# Patient Record
Sex: Female | Born: 1950 | Race: White | Hispanic: No | Marital: Married | Smoking: Never smoker
Health system: Southern US, Community
[De-identification: ages and names within clinical notes are randomized; demographics above are authoritative.]

## PROBLEM LIST (undated history)

## (undated) DIAGNOSIS — R109 Unspecified abdominal pain: Secondary | ICD-10-CM

## (undated) DIAGNOSIS — R6889 Other general symptoms and signs: Secondary | ICD-10-CM

## (undated) DIAGNOSIS — I472 Ventricular tachycardia, unspecified: Secondary | ICD-10-CM

## (undated) DIAGNOSIS — J22 Unspecified acute lower respiratory infection: Secondary | ICD-10-CM

## (undated) DIAGNOSIS — R0989 Other specified symptoms and signs involving the circulatory and respiratory systems: Secondary | ICD-10-CM

## (undated) DIAGNOSIS — M62838 Other muscle spasm: Secondary | ICD-10-CM

## (undated) DIAGNOSIS — R635 Abnormal weight gain: Secondary | ICD-10-CM

## (undated) DIAGNOSIS — Z9889 Other specified postprocedural states: Secondary | ICD-10-CM

## (undated) DIAGNOSIS — R29898 Other symptoms and signs involving the musculoskeletal system: Secondary | ICD-10-CM

## (undated) DIAGNOSIS — R059 Cough, unspecified: Secondary | ICD-10-CM

## (undated) DIAGNOSIS — R0602 Shortness of breath: Secondary | ICD-10-CM

## (undated) DIAGNOSIS — J069 Acute upper respiratory infection, unspecified: Secondary | ICD-10-CM

## (undated) DIAGNOSIS — R001 Bradycardia, unspecified: Secondary | ICD-10-CM

## (undated) DIAGNOSIS — L03019 Cellulitis of unspecified finger: Secondary | ICD-10-CM

## (undated) DIAGNOSIS — R9431 Abnormal electrocardiogram [ECG] [EKG]: Secondary | ICD-10-CM

## (undated) DIAGNOSIS — R7302 Impaired glucose tolerance (oral): Secondary | ICD-10-CM

## (undated) DIAGNOSIS — M797 Fibromyalgia: Secondary | ICD-10-CM

## (undated) DIAGNOSIS — R52 Pain, unspecified: Secondary | ICD-10-CM

## (undated) DIAGNOSIS — R5383 Other fatigue: Secondary | ICD-10-CM

## (undated) DIAGNOSIS — R0981 Nasal congestion: Secondary | ICD-10-CM

## (undated) DIAGNOSIS — J40 Bronchitis, not specified as acute or chronic: Secondary | ICD-10-CM

## (undated) DIAGNOSIS — R55 Syncope and collapse: Secondary | ICD-10-CM

## (undated) DIAGNOSIS — R319 Hematuria, unspecified: Secondary | ICD-10-CM

## (undated) DIAGNOSIS — K862 Cyst of pancreas: Secondary | ICD-10-CM

## (undated) DIAGNOSIS — R062 Wheezing: Secondary | ICD-10-CM

## (undated) DIAGNOSIS — I10 Essential (primary) hypertension: Secondary | ICD-10-CM

## (undated) DIAGNOSIS — M129 Arthropathy, unspecified: Secondary | ICD-10-CM

## (undated) DIAGNOSIS — J329 Chronic sinusitis, unspecified: Secondary | ICD-10-CM

## (undated) DIAGNOSIS — I495 Sick sinus syndrome: Secondary | ICD-10-CM

## (undated) DIAGNOSIS — M1990 Unspecified osteoarthritis, unspecified site: Secondary | ICD-10-CM

## (undated) DIAGNOSIS — R42 Dizziness and giddiness: Secondary | ICD-10-CM

## (undated) DIAGNOSIS — R17 Unspecified jaundice: Secondary | ICD-10-CM

## (undated) DIAGNOSIS — G459 Transient cerebral ischemic attack, unspecified: Secondary | ICD-10-CM

## (undated) DIAGNOSIS — E785 Hyperlipidemia, unspecified: Secondary | ICD-10-CM

## (undated) DIAGNOSIS — I340 Nonrheumatic mitral (valve) insufficiency: Secondary | ICD-10-CM

## (undated) DIAGNOSIS — N390 Urinary tract infection, site not specified: Secondary | ICD-10-CM

## (undated) DIAGNOSIS — Z9109 Other allergy status, other than to drugs and biological substances: Secondary | ICD-10-CM

## (undated) DIAGNOSIS — D649 Anemia, unspecified: Secondary | ICD-10-CM

## (undated) HISTORY — DX: Other symptoms and signs involving the musculoskeletal system: R29.898

## (undated) HISTORY — DX: Other fatigue: R53.83

## (undated) HISTORY — DX: Arthropathy, unspecified: M12.9

## (undated) HISTORY — DX: Wheezing: R06.2

## (undated) HISTORY — DX: Cyst of pancreas: K86.2

## (undated) HISTORY — DX: Cough, unspecified: R05.9

## (undated) HISTORY — DX: Bronchitis, not specified as acute or chronic: J40

## (undated) HISTORY — DX: Bradycardia, unspecified: R00.1

## (undated) HISTORY — DX: Dizziness and giddiness: R42

## (undated) HISTORY — DX: Cellulitis of unspecified finger: L03.019

## (undated) HISTORY — DX: Syncope and collapse: R55

## (undated) HISTORY — DX: Anemia, unspecified: D64.9

## (undated) HISTORY — DX: Impaired glucose tolerance (oral): R73.02

## (undated) HISTORY — DX: Other allergy status, other than to drugs and biological substances: Z91.09

## (undated) HISTORY — DX: Sick sinus syndrome: I49.5

## (undated) HISTORY — DX: Unspecified osteoarthritis, unspecified site: M19.90

## (undated) HISTORY — PX: BACK SURGERY: SHX140

## (undated) HISTORY — DX: Shortness of breath: R06.02

## (undated) HISTORY — DX: Other specified symptoms and signs involving the circulatory and respiratory systems: R09.89

## (undated) HISTORY — DX: Unspecified abdominal pain: R10.9

## (undated) HISTORY — DX: Other general symptoms and signs: R68.89

## (undated) HISTORY — PX: OTHER SURGICAL HISTORY: SHX169

## (undated) HISTORY — PX: HYSTERECTOMY ABDOMINAL WITH SALPINGECTOMY: SHX6725

## (undated) HISTORY — DX: Hematuria, unspecified: R31.9

## (undated) HISTORY — DX: Other muscle spasm: M62.838

## (undated) HISTORY — PX: GALLBLADDER SURGERY: SHX652

## (undated) HISTORY — DX: Acute upper respiratory infection, unspecified: J06.9

## (undated) HISTORY — DX: Abnormal weight gain: R63.5

## (undated) HISTORY — PX: CHOLECYSTECTOMY: SHX55

## (undated) HISTORY — PX: TOTAL SHOULDER REPLACEMENT: SUR1217

## (undated) HISTORY — DX: Urinary tract infection, site not specified: N39.0

## (undated) HISTORY — DX: Hyperlipidemia, unspecified: E78.5

## (undated) HISTORY — DX: Unspecified acute lower respiratory infection: J22

## (undated) HISTORY — DX: Ventricular tachycardia, unspecified: I47.20

## (undated) HISTORY — DX: Unspecified jaundice: R17

## (undated) HISTORY — DX: Nasal congestion: R09.81

## (undated) HISTORY — DX: Nonrheumatic mitral (valve) insufficiency: I34.0

## (undated) HISTORY — DX: Abnormal electrocardiogram (ECG) (EKG): R94.31

## (undated) HISTORY — DX: Chronic sinusitis, unspecified: J32.9

## (undated) HISTORY — DX: Pain, unspecified: R52

---

## 2011-04-18 DIAGNOSIS — M797 Fibromyalgia: Secondary | ICD-10-CM | POA: Insufficient documentation

## 2011-04-18 DIAGNOSIS — M545 Low back pain, unspecified: Secondary | ICD-10-CM | POA: Insufficient documentation

## 2011-04-18 DIAGNOSIS — M6530 Trigger finger, unspecified finger: Secondary | ICD-10-CM | POA: Insufficient documentation

## 2012-06-07 DIAGNOSIS — R059 Cough, unspecified: Secondary | ICD-10-CM | POA: Insufficient documentation

## 2012-07-21 DIAGNOSIS — J302 Other seasonal allergic rhinitis: Secondary | ICD-10-CM | POA: Insufficient documentation

## 2014-03-27 DIAGNOSIS — H9209 Otalgia, unspecified ear: Secondary | ICD-10-CM | POA: Insufficient documentation

## 2014-03-27 DIAGNOSIS — J309 Allergic rhinitis, unspecified: Secondary | ICD-10-CM | POA: Insufficient documentation

## 2014-10-12 DIAGNOSIS — M159 Polyosteoarthritis, unspecified: Secondary | ICD-10-CM | POA: Insufficient documentation

## 2014-10-12 DIAGNOSIS — I10 Essential (primary) hypertension: Secondary | ICD-10-CM | POA: Insufficient documentation

## 2014-12-05 DIAGNOSIS — N951 Menopausal and female climacteric states: Secondary | ICD-10-CM | POA: Insufficient documentation

## 2015-04-03 DIAGNOSIS — R202 Paresthesia of skin: Secondary | ICD-10-CM | POA: Insufficient documentation

## 2015-05-30 DIAGNOSIS — D509 Iron deficiency anemia, unspecified: Secondary | ICD-10-CM | POA: Insufficient documentation

## 2015-05-30 DIAGNOSIS — D649 Anemia, unspecified: Secondary | ICD-10-CM | POA: Insufficient documentation

## 2015-10-22 DIAGNOSIS — G459 Transient cerebral ischemic attack, unspecified: Secondary | ICD-10-CM | POA: Insufficient documentation

## 2015-10-28 DIAGNOSIS — I9581 Postprocedural hypotension: Secondary | ICD-10-CM | POA: Insufficient documentation

## 2015-10-28 DIAGNOSIS — M19012 Primary osteoarthritis, left shoulder: Secondary | ICD-10-CM | POA: Insufficient documentation

## 2015-10-29 DIAGNOSIS — I9581 Postprocedural hypotension: Secondary | ICD-10-CM | POA: Insufficient documentation

## 2015-10-29 DIAGNOSIS — D62 Acute posthemorrhagic anemia: Secondary | ICD-10-CM | POA: Insufficient documentation

## 2015-11-21 DIAGNOSIS — Z9181 History of falling: Secondary | ICD-10-CM | POA: Insufficient documentation

## 2015-12-17 DIAGNOSIS — Z96612 Presence of left artificial shoulder joint: Secondary | ICD-10-CM | POA: Insufficient documentation

## 2015-12-31 DIAGNOSIS — N8111 Cystocele, midline: Secondary | ICD-10-CM | POA: Insufficient documentation

## 2016-01-02 DIAGNOSIS — R339 Retention of urine, unspecified: Secondary | ICD-10-CM | POA: Insufficient documentation

## 2016-01-21 DIAGNOSIS — R7303 Prediabetes: Secondary | ICD-10-CM | POA: Insufficient documentation

## 2016-02-06 DIAGNOSIS — K921 Melena: Secondary | ICD-10-CM | POA: Insufficient documentation

## 2016-02-06 DIAGNOSIS — Z79899 Other long term (current) drug therapy: Secondary | ICD-10-CM | POA: Insufficient documentation

## 2016-02-06 DIAGNOSIS — Z860100 Personal history of colon polyps, unspecified: Secondary | ICD-10-CM | POA: Insufficient documentation

## 2016-02-06 DIAGNOSIS — R195 Other fecal abnormalities: Secondary | ICD-10-CM | POA: Insufficient documentation

## 2016-02-06 DIAGNOSIS — Z791 Long term (current) use of non-steroidal anti-inflammatories (NSAID): Secondary | ICD-10-CM | POA: Insufficient documentation

## 2016-07-16 DIAGNOSIS — K5790 Diverticulosis of intestine, part unspecified, without perforation or abscess without bleeding: Secondary | ICD-10-CM | POA: Insufficient documentation

## 2016-10-15 DIAGNOSIS — M75121 Complete rotator cuff tear or rupture of right shoulder, not specified as traumatic: Secondary | ICD-10-CM | POA: Insufficient documentation

## 2017-03-25 DIAGNOSIS — H40043 Steroid responder, bilateral: Secondary | ICD-10-CM | POA: Insufficient documentation

## 2017-03-25 DIAGNOSIS — H16223 Keratoconjunctivitis sicca, not specified as Sjogren's, bilateral: Secondary | ICD-10-CM | POA: Insufficient documentation

## 2017-03-25 DIAGNOSIS — Z83511 Family history of glaucoma: Secondary | ICD-10-CM | POA: Insufficient documentation

## 2017-03-25 DIAGNOSIS — M154 Erosive (osteo)arthritis: Secondary | ICD-10-CM | POA: Insufficient documentation

## 2017-03-25 DIAGNOSIS — H2513 Age-related nuclear cataract, bilateral: Secondary | ICD-10-CM | POA: Insufficient documentation

## 2017-03-25 DIAGNOSIS — H524 Presbyopia: Secondary | ICD-10-CM | POA: Insufficient documentation

## 2017-03-25 DIAGNOSIS — Z79899 Other long term (current) drug therapy: Secondary | ICD-10-CM | POA: Insufficient documentation

## 2017-03-25 DIAGNOSIS — H33322 Round hole, left eye: Secondary | ICD-10-CM | POA: Insufficient documentation

## 2017-07-08 DIAGNOSIS — G609 Hereditary and idiopathic neuropathy, unspecified: Secondary | ICD-10-CM | POA: Insufficient documentation

## 2017-11-05 DIAGNOSIS — Z79890 Hormone replacement therapy (postmenopausal): Secondary | ICD-10-CM | POA: Insufficient documentation

## 2017-11-05 DIAGNOSIS — Z90710 Acquired absence of both cervix and uterus: Secondary | ICD-10-CM | POA: Insufficient documentation

## 2018-01-02 DIAGNOSIS — R232 Flushing: Secondary | ICD-10-CM | POA: Insufficient documentation

## 2018-02-24 DIAGNOSIS — K862 Cyst of pancreas: Secondary | ICD-10-CM | POA: Insufficient documentation

## 2018-05-02 DIAGNOSIS — Z8673 Personal history of transient ischemic attack (TIA), and cerebral infarction without residual deficits: Secondary | ICD-10-CM | POA: Insufficient documentation

## 2018-06-16 DIAGNOSIS — R140 Abdominal distension (gaseous): Secondary | ICD-10-CM | POA: Insufficient documentation

## 2018-06-16 DIAGNOSIS — K219 Gastro-esophageal reflux disease without esophagitis: Secondary | ICD-10-CM | POA: Insufficient documentation

## 2018-06-21 DIAGNOSIS — I4729 Other ventricular tachycardia: Secondary | ICD-10-CM | POA: Insufficient documentation

## 2018-06-24 DIAGNOSIS — R0602 Shortness of breath: Secondary | ICD-10-CM | POA: Insufficient documentation

## 2018-07-16 DIAGNOSIS — I071 Rheumatic tricuspid insufficiency: Secondary | ICD-10-CM | POA: Insufficient documentation

## 2018-07-19 DIAGNOSIS — E559 Vitamin D deficiency, unspecified: Secondary | ICD-10-CM | POA: Insufficient documentation

## 2018-08-02 DIAGNOSIS — M8580 Other specified disorders of bone density and structure, unspecified site: Secondary | ICD-10-CM | POA: Insufficient documentation

## 2019-01-12 DIAGNOSIS — E785 Hyperlipidemia, unspecified: Secondary | ICD-10-CM | POA: Insufficient documentation

## 2019-02-26 ENCOUNTER — Emergency Department (HOSPITAL_BASED_OUTPATIENT_CLINIC_OR_DEPARTMENT_OTHER)
Admission: EM | Admit: 2019-02-26 | Discharge: 2019-02-26 | Disposition: A | Discharge status: Home / Self Care | Payer: Medicare Other | Attending: Emergency Medicine | Admitting: Emergency Medicine

## 2019-02-26 ENCOUNTER — Other Ambulatory Visit: Payer: Self-pay

## 2019-02-26 ENCOUNTER — Encounter (HOSPITAL_BASED_OUTPATIENT_CLINIC_OR_DEPARTMENT_OTHER): Payer: Self-pay | Admitting: Emergency Medicine

## 2019-02-26 DIAGNOSIS — I10 Essential (primary) hypertension: Secondary | ICD-10-CM | POA: Insufficient documentation

## 2019-02-26 DIAGNOSIS — Z79899 Other long term (current) drug therapy: Secondary | ICD-10-CM | POA: Diagnosis not present

## 2019-02-26 DIAGNOSIS — Z7901 Long term (current) use of anticoagulants: Secondary | ICD-10-CM | POA: Diagnosis not present

## 2019-02-26 DIAGNOSIS — N12 Tubulo-interstitial nephritis, not specified as acute or chronic: Secondary | ICD-10-CM | POA: Diagnosis not present

## 2019-02-26 DIAGNOSIS — Z8673 Personal history of transient ischemic attack (TIA), and cerebral infarction without residual deficits: Secondary | ICD-10-CM | POA: Insufficient documentation

## 2019-02-26 DIAGNOSIS — M545 Low back pain: Secondary | ICD-10-CM | POA: Diagnosis present

## 2019-02-26 HISTORY — DX: Essential (primary) hypertension: I10

## 2019-02-26 HISTORY — DX: Transient cerebral ischemic attack, unspecified: G45.9

## 2019-02-26 LAB — URINALYSIS, ROUTINE W REFLEX MICROSCOPIC
Bilirubin Urine: NEGATIVE
Glucose, UA: NEGATIVE mg/dL
Ketones, ur: NEGATIVE mg/dL
Nitrite: NEGATIVE
Protein, ur: NEGATIVE mg/dL
Specific Gravity, Urine: 1.02 (ref 1.005–1.030)
pH: 6.5 (ref 5.0–8.0)

## 2019-02-26 LAB — URINALYSIS, MICROSCOPIC (REFLEX)

## 2019-02-26 MED ORDER — CEPHALEXIN 500 MG PO CAPS: 500.0000 mg | ORAL_CAPSULE | Freq: Four times a day (QID) | ORAL | 0 refills | Status: AC

## 2019-02-26 NOTE — ED Triage Notes (Signed)
L lower back pain x 2 days. Denies injury. Endorses urinary frequency. Recently tx for UTI.

## 2019-02-26 NOTE — Discharge Instructions (Signed)
Your flank pain is likely secondary to a kidney infection.  The medical term for this is called pyelonephritis.  Will give you an antibiotic called Keflex, you will take this 4 times per day and the dose will be 500 mg.  Take it for 7 days.  Her symptoms do not improve we have any new symptoms would recommend follow-up with PCP.

## 2019-02-26 NOTE — ED Notes (Signed)
ED Provider at bedside. 

## 2019-02-26 NOTE — ED Provider Notes (Signed)
[facility] EMERGENCY DEPARTMENT Provider Note   CSN: [id number] Arrival date & time: 02/26/19  0845    History   Chief Complaint Chief Complaint  Patient presents with  . Back Pain    HPI Angel Cherry is a 68 y.o. female who presents for 3-day history of left flank/back pain.  Patient states that the pain gradually throughout the day on Friday.  It has slowly gotten worse since then.  She has tried a heating pad, and 1 dose of Tylenol which have not helped her.  She has not had any fevers as far she knows.  She has urinary frequency, but no burning or cramping.   Of note patient was treated for a UTI by her PCP 3 to 4 weeks ago.  She was given ciprofloxacin for 5 days, which she completed.   Past Medical History:  Diagnosis Date  . Hypertension   . TIA (transient ischemic attack)     There are no active problems to display for this patient.   History reviewed. No pertinent surgical history.   OB History   No obstetric history on file.      Home Medications    Prior to Admission medications   Medication Sig Start Date End Date Taking? Authorizing Provider  atorvastatin (LIPITOR) 20 MG tablet Take 20 mg by mouth daily.   Yes [provider]  clopidogrel (PLAVIX) 75 MG tablet Take 75 mg by mouth daily.   Yes [provider]  lisinopril (ZESTRIL) 20 MG tablet Take 20 mg by mouth daily.   Yes [provider]  magnesium 30 MG tablet Take 200 mg by mouth 2 (two) times daily.   Yes [provider]  metoprolol tartrate (LOPRESSOR) 25 MG tablet Take 25 mg by mouth 2 (two) times daily.   Yes [provider]  montelukast (SINGULAIR) 10 MG tablet Take 10 mg by mouth at bedtime.   Yes [provider]  pantoprazole (PROTONIX) 40 MG tablet Take 40 mg by mouth daily.   Yes [provider]  traZODone (DESYREL) 150 MG tablet Take by mouth at bedtime.   Yes [provider]  cephALEXin (KEFLEX) 500 MG  capsule Take 1 capsule (500 mg total) by mouth 4 (four) times daily for 7 days. 02/26/19 03/05/19  Myrene BuddyFletcher, Andrya Roppolo, MD    Family History No family history on file.  Social History Social History   Tobacco Use  . Smoking status: Never Smoker  . Smokeless tobacco: Never Used  Substance Use Topics  . Alcohol use: Not Currently  . Drug use: Not on file     Allergies   Daypro [oxaprozin]   Review of Systems Review of Systems  Constitutional: Negative for activity change, appetite change, chills and fever.  Respiratory: Negative for apnea, cough and chest tightness.   Cardiovascular: Negative for chest pain.  Gastrointestinal: Negative for abdominal distention, abdominal pain, constipation, diarrhea, nausea and vomiting.  Genitourinary: Positive for flank pain, frequency and urgency. Negative for hematuria and pelvic pain.  Musculoskeletal: Positive for back pain.  Neurological: Negative for light-headedness.  Psychiatric/Behavioral: Negative for confusion.    Physical Exam Updated Vital Signs BP (!) 150/97 (BP Location: Left Arm)   Pulse 65   Temp 99.1 F (37.3 C) (Oral)   Resp 18   Ht 5\' 2"  (1.575 m)   Wt 63.5 kg   SpO2 99%   BMI 25.61 kg/m   Physical Exam Constitutional:      General: She is not  in acute distress.    Appearance: Normal appearance. She is not ill-appearing.  HENT:     Head: Normocephalic and atraumatic.     Mouth/Throat:     Mouth: Mucous membranes are moist.  Neck:     Musculoskeletal: Normal range of motion.  Cardiovascular:     Rate and Rhythm: Normal rate and regular rhythm.     Heart sounds: Normal heart sounds. No murmur. No friction rub.  Pulmonary:     Effort: Pulmonary effort is normal. No respiratory distress.     Breath sounds: Normal breath sounds.  Abdominal:     General: Abdomen is flat. There is no distension.     Palpations: Abdomen is soft.     Tenderness: There is left CVA tenderness.  Musculoskeletal: Normal range of  motion.  Skin:    General: Skin is warm.     Capillary Refill: Capillary refill takes less than 2 seconds.  Neurological:     General: No focal deficit present.     Mental Status: She is alert and oriented to person, place, and time.     Cranial Nerves: No cranial nerve deficit.  Psychiatric:        Mood and Affect: Mood normal.   Back: Moderate tenderness to palpation left flank/back and CVA distribution.  Able to flex back with no issue.  Unable to extend back or rotate back to any significant degree due to pain.   ED Treatments / Results  Labs (all labs ordered are listed, but only abnormal results are displayed) Labs Reviewed  URINALYSIS, ROUTINE W REFLEX MICROSCOPIC - Abnormal; Notable for the following components:      Result Value   Hgb urine dipstick TRACE (*)    Leukocytes,Ua SMALL (*)    All other components within normal limits  URINALYSIS, MICROSCOPIC (REFLEX) - Abnormal; Notable for the following components:   Bacteria, UA MANY (*)    All other components within normal limits  UA: Trace Hgb, small leuks High-power field: Many bacteria, 6-10 squamous epithelial cells, 6-10 RBC, 11-20 white blood cell.  Mucus and granular casts present  EKG None  Radiology No results found.  Procedures Procedures  Medications Ordered in ED Medications - No data to display   Initial Impression / Assessment and Plan / ED Course  I have reviewed the triage vital signs and the nursing notes.  Pertinent labs & imaging results that were available during my care of the patient were reviewed by me and considered in my medical decision making (see chart for details).   68 year old female with left CVA tenderness.  UA and high-power field showing bacteria, red blood cells, and leukocytes.  Patient's presentation and exam consistent with pyelonephritis.  Will treat with Keflex 500 mg 4 times daily for 7 days.  Given this is patient's second UTI in 3 to 4 weeks, could consider additional  anatomic (I.E. Kidney stones) work-up if she presents with similar symptoms.     Final Clinical Impressions(s) / ED Diagnoses   Final diagnoses:  Pyelonephritis    ED Discharge Orders         Ordered    cephALEXin (KEFLEX) 500 MG capsule  4 times daily     02/26/19 0931           Guadalupe Dawn, MD 02/26/19 6237    Lajean Saver, MD 02/26/19 1601    Lajean Saver, MD 02/26/19 [phone]

## 2019-04-11 DIAGNOSIS — Z9889 Other specified postprocedural states: Secondary | ICD-10-CM | POA: Insufficient documentation

## 2019-04-11 DIAGNOSIS — Z96611 Presence of right artificial shoulder joint: Secondary | ICD-10-CM | POA: Insufficient documentation

## 2019-04-25 DIAGNOSIS — R300 Dysuria: Secondary | ICD-10-CM | POA: Insufficient documentation

## 2019-07-27 DIAGNOSIS — G9520 Unspecified cord compression: Secondary | ICD-10-CM | POA: Insufficient documentation

## 2019-08-25 DIAGNOSIS — N3281 Overactive bladder: Secondary | ICD-10-CM | POA: Insufficient documentation

## 2020-03-20 DIAGNOSIS — I499 Cardiac arrhythmia, unspecified: Secondary | ICD-10-CM | POA: Insufficient documentation

## 2020-05-31 ENCOUNTER — Ambulatory Visit: Payer: Medicare Other | Attending: Internal Medicine

## 2020-05-31 DIAGNOSIS — Z23 Encounter for immunization: Secondary | ICD-10-CM

## 2020-05-31 NOTE — Progress Notes (Signed)
   Covid-19 Vaccination Clinic  Name:  Angel Cherry    MRN: [id number] DOB: 10/26/1950  05/31/2020  [name] was observed post Covid-19 immunization for 15 minutes without incident. She was provided with Vaccine Information Sheet and instruction to access the V-Safe system.   [name] was instructed to call [phone] with any severe reactions post vaccine: Marland Kitchen Difficulty breathing  . Swelling of face and throat  . A fast heartbeat  . A bad rash all over body  . Dizziness and weakness

## 2020-07-27 HISTORY — PX: CATARACT EXTRACTION: SUR2

## 2021-01-07 DIAGNOSIS — M7552 Bursitis of left shoulder: Secondary | ICD-10-CM | POA: Insufficient documentation

## 2021-05-14 ENCOUNTER — Other Ambulatory Visit: Payer: Self-pay

## 2021-05-14 ENCOUNTER — Emergency Department (HOSPITAL_BASED_OUTPATIENT_CLINIC_OR_DEPARTMENT_OTHER)
Admission: EM | Admit: 2021-05-14 | Discharge: 2021-05-14 | Disposition: A | Discharge status: Home / Self Care | Payer: Medicare Other | Attending: Emergency Medicine | Admitting: Emergency Medicine

## 2021-05-14 ENCOUNTER — Emergency Department (HOSPITAL_COMMUNITY): Payer: Medicare Other

## 2021-05-14 ENCOUNTER — Encounter (HOSPITAL_BASED_OUTPATIENT_CLINIC_OR_DEPARTMENT_OTHER): Payer: Self-pay | Admitting: Emergency Medicine

## 2021-05-14 DIAGNOSIS — Z8673 Personal history of transient ischemic attack (TIA), and cerebral infarction without residual deficits: Secondary | ICD-10-CM | POA: Insufficient documentation

## 2021-05-14 DIAGNOSIS — E860 Dehydration: Secondary | ICD-10-CM | POA: Diagnosis not present

## 2021-05-14 DIAGNOSIS — Z79899 Other long term (current) drug therapy: Secondary | ICD-10-CM | POA: Insufficient documentation

## 2021-05-14 DIAGNOSIS — R531 Weakness: Secondary | ICD-10-CM | POA: Insufficient documentation

## 2021-05-14 DIAGNOSIS — Z7902 Long term (current) use of antithrombotics/antiplatelets: Secondary | ICD-10-CM | POA: Diagnosis not present

## 2021-05-14 DIAGNOSIS — I10 Essential (primary) hypertension: Secondary | ICD-10-CM | POA: Insufficient documentation

## 2021-05-14 LAB — BASIC METABOLIC PANEL
Anion gap: 6 (ref 5–15)
BUN: 17 mg/dL (ref 8–23)
CO2: 26 mmol/L (ref 22–32)
Calcium: 9 mg/dL (ref 8.9–10.3)
Chloride: 104 mmol/L (ref 98–111)
Creatinine, Ser: 0.9 mg/dL (ref 0.44–1.00)
GFR, Estimated: >60 mL/min (ref >60)
Glucose, Bld: 100 mg/dL — ABNORMAL HIGH (ref 70–99)
Potassium: 3.8 mmol/L (ref 3.5–5.1)
Sodium: 136 mmol/L (ref 135–145)

## 2021-05-14 LAB — CBC WITH DIFFERENTIAL/PLATELET
Abs Immature Granulocytes: 0.02 10*3/uL (ref 0.00–0.07)
Basophils Absolute: 0 10*3/uL (ref 0.0–0.1)
Basophils Relative: 1 %
Eosinophils Absolute: 0.1 10*3/uL (ref 0.0–0.5)
Eosinophils Relative: 2 %
HCT: 31.4 % — ABNORMAL LOW (ref 36.0–46.0)
Hemoglobin: 11 g/dL — ABNORMAL LOW (ref 12.0–15.0)
Immature Granulocytes: 0 %
Lymphocytes Relative: 23 %
Lymphs Abs: 1.1 10*3/uL (ref 0.7–4.0)
MCH: 31.5 pg (ref 26.0–34.0)
MCHC: 35 g/dL (ref 30.0–36.0)
MCV: 90 fL (ref 80.0–100.0)
Monocytes Absolute: 0.4 10*3/uL (ref 0.1–1.0)
Monocytes Relative: 7 %
Neutro Abs: 3.2 10*3/uL (ref 1.7–7.7)
Neutrophils Relative %: 67 %
Platelets: 253 10*3/uL (ref 150–400)
RBC: 3.49 MIL/uL — ABNORMAL LOW (ref 3.87–5.11)
RDW: 13 % (ref 11.5–15.5)
WBC: 4.8 10*3/uL (ref 4.0–10.5)
nRBC: 0 % (ref 0.0–0.2)

## 2021-05-14 MED ORDER — MECLIZINE HCL 25 MG PO TABS
25.0000 mg | ORAL_TABLET | Freq: Once | ORAL | Status: AC
  Administered 2021-05-14: 25 mg via ORAL
  Filled 2021-05-14: qty 1

## 2021-05-14 MED ORDER — DEXAMETHASONE 4 MG PO TABS
10.0000 mg | ORAL_TABLET | Freq: Once | ORAL | Status: AC
  Administered 2021-05-14: 10 mg via ORAL
  Filled 2021-05-14: qty 3

## 2021-05-14 MED ORDER — MECLIZINE HCL 25 MG PO TABS: 25.0000 mg | ORAL_TABLET | Freq: Three times a day (TID) | ORAL | 0 refills | Status: AC | PRN

## 2021-05-14 MED ORDER — SODIUM CHLORIDE 0.9 % IV BOLUS
1000.0000 mL | Freq: Once | INTRAVENOUS | Status: AC
  Administered 2021-05-14: 1000 mL via INTRAVENOUS

## 2021-05-14 NOTE — ED Provider Notes (Signed)
[facility] EMERGENCY DEPARTMENT Provider Note   CSN: [id number] Arrival date & time: 05/14/21  7867     History Chief Complaint  Patient presents with   Weakness    fatigue    Clinical Course as of 05/14/21 1720  Wed May 14, 2021  1247 70 yo female presenting with dizziness, worse with standing, for several weeks.  Family doc referred to ED for dehydration evaluation and CVA rule out.  [place] transferred [place] brain.  Pt received 1 L IVF and antivert and decadron for mild dehydration and vertigo/mild ataxia - sent for CVA rule out on MRI. [MT]  1255 IMPRESSION: No acute or reversible intracranial finding. Few foci of abnormal T2 and FLAIR signal within the white matter of the forceps major that could represent a manifestation of early/mild chronic small vessel disease or could relate to prenatal or perinatal insult.   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 [MT]    Clinical Course User Index [MT] Terald Sleeper, MD   Patient reports that her PCP is checked for her UTI with a urine culture just this week, which was negative.  She had been treated for UTI earlier in the month.  She also reports had a negative PCR and COVID flu test earlier this week.  She denies any chest pain, pressure, difficulty breathing.  HPI     Past Medical History:  Diagnosis Date   Hypertension    TIA (transient ischemic attack)     There are no problems to display for this patient.   History reviewed. No pertinent surgical history.   OB History   No obstetric history on file.     No family history on file.  Social History   Tobacco Use   Smoking status: Never   Smokeless tobacco: Never  Substance Use Topics   Alcohol use: Not Currently    Home Medications Prior to Admission medications   Medication Sig Start Date End Date Taking? Authorizing Provider  meclizine (ANTIVERT) 25 MG tablet Take 1 tablet (25 mg total) by mouth  3 (three) times daily as needed for up to 21 days for dizziness. 05/14/21 06/04/21 Yes Treanna Dumler, Kermit Balo, MD  atorvastatin (LIPITOR) 20 MG tablet Take 20 mg by mouth daily.    [provider]  clopidogrel (PLAVIX) 75 MG tablet Take 75 mg by mouth daily.    [provider]  lisinopril (ZESTRIL) 20 MG tablet Take 20 mg by mouth daily.    [provider]  magnesium 30 MG tablet Take 200 mg by mouth 2 (two) times daily.    [provider]  metoprolol tartrate (LOPRESSOR) 25 MG tablet Take 25 mg by mouth 2 (two) times daily.    [provider]  montelukast (SINGULAIR) 10 MG tablet Take 10 mg by mouth at bedtime.    [provider]  pantoprazole (PROTONIX) 40 MG tablet Take 40 mg by mouth daily.    [provider]  traZODone (DESYREL) 150 MG tablet Take by mouth at bedtime.    [provider]    Allergies    Denture adhesive, Oxycodone, Terconazole, Daypro [oxaprozin], and Venlafaxine  Review of Systems   Review of Systems  Constitutional:  Negative for chills and fever.  Respiratory:  Negative for cough and shortness of breath.   Cardiovascular:  Negative for chest pain and palpitations.  Gastrointestinal:  Negative for abdominal pain and vomiting.  Genitourinary:  Negative for dysuria and hematuria.  Musculoskeletal:  Negative for arthralgias and back pain.  Skin:  Negative for color change and rash.  Neurological:  Positive for light-headedness. Negative for syncope, facial asymmetry, speech difficulty, weakness and numbness.  All other systems reviewed and are negative.  Physical Exam Updated Vital Signs BP (!) 145/67   Pulse 65   Temp 98 F (36.7 C) (Oral)   Resp 16   Ht 5\' 2"  (1.575 m)   Wt 59 kg   SpO2 99%   BMI 23.78 kg/m   Physical Exam Constitutional:      General: She is not in acute distress. HENT:     Head: Normocephalic and atraumatic.  Eyes:     Conjunctiva/sclera: Conjunctivae normal.      Pupils: Pupils are equal, round, and reactive to light.  Cardiovascular:     Rate and Rhythm: Normal rate and regular rhythm.  Pulmonary:     Effort: Pulmonary effort is normal. No respiratory distress.  Abdominal:     General: There is no distension.     Tenderness: There is no abdominal tenderness.  Skin:    General: Skin is warm and dry.  Neurological:     General: No focal deficit present.     Mental Status: She is alert and oriented to person, place, and time. Mental status is at baseline.     Motor: No weakness.  Psychiatric:        Mood and Affect: Mood normal.        Behavior: Behavior normal.    ED Results / Procedures / Treatments   Labs (all labs ordered are listed, but only abnormal results are displayed) Labs Reviewed  CBC WITH DIFFERENTIAL/PLATELET - Abnormal; Notable for the following components:      Result Value   RBC 3.49 (*)    Hemoglobin 11.0 (*)    HCT 31.4 (*)    All other components within normal limits  BASIC METABOLIC PANEL - Abnormal; Notable for the following components:   Glucose, Bld 100 (*)    All other components within normal limits    EKG EKG Interpretation  Date/Time:  Wednesday May 14 2021 08:36:47 EDT Ventricular Rate:  57 PR Interval:  142 QRS Duration: 95 QT Interval:  428 QTC Calculation: 417 R Axis:   52 Text Interpretation: Sinus rhythm Probable anteroseptal infarct, old No old tracing to compare Confirmed by [phone] [id number]) on 05/14/2021 9:08:10 AM  Radiology MR BRAIN WO CONTRAST  Result Date: 05/14/2021 CLINICAL DATA:  Nonspecific dizziness. Numbness of the lips. Tingling in the left hand. EXAM: MRI HEAD WITHOUT CONTRAST TECHNIQUE: Multiplanar, multiecho pulse sequences of the brain and surrounding structures were obtained without intravenous contrast. COMPARISON:  Head CT 09/17/2019.  MRI 09/18/2019. FINDINGS: Brain: Diffusion imaging does not show any acute or subacute infarction. No abnormality affects the  brainstem or cerebellum. Cerebral hemispheres are normal except for a few punctate foci of T2 and FLAIR signal within the white matter of the forceps major regions. No cortical or large vessel territory infarction. No mass lesion, hemorrhage, hydrocephalus or extra-axial collection. Vascular: Major vessels at the base of the brain show flow. Skull and upper cervical spine: Negative Sinuses/Orbits: Chronic inflammatory change of the left maxillary sinus. Orbits negative. Other: None IMPRESSION: No acute or reversible intracranial finding. Few foci of abnormal T2 and FLAIR signal within the white matter of the forceps major that could represent a manifestation of early/mild chronic small vessel disease or could relate to prenatal or perinatal insult. Chronic inflammatory  changes of the left maxillary sinus. Electronically Signed   By: Paulina Fusi M.D.   On: 05/14/2021 12:47    Procedures Procedures   Medications Ordered in ED Medications  sodium chloride 0.9 % bolus 1,000 mL (0 mLs Intravenous Stopped 05/14/21 1003)  meclizine (ANTIVERT) tablet 25 mg (25 mg Oral Given 05/14/21 0839)  dexamethasone (DECADRON) tablet 10 mg (10 mg Oral Given 05/14/21 1610)    ED Course  I have reviewed the triage vital signs and the nursing notes.  Pertinent labs & imaging results that were available during my care of the patient were reviewed by me and considered in my medical decision making (see chart for details).  Patient is here for generalized fatigue for several weeks, reporting dizziness as well the past 10 days, particularly when standing up.  Patient was given IV fluids and Decadron and Antivert prior to her transfer to our facility.  She feels largely unchanged.  She has no active nystagmus on my exam, she is able to ambulate and does have perhaps a mild limp or favoring 1 leg.  She is steady on her feet otherwise.  Her MRI was reviewed showing no focal infiltrate or lesion to explain her symptoms.  I do not  see evidence of anemia, or significant dehydration on her BMP or CBC.  No fever or leukocytosis to suspect ongoing infection.  She has been treated for UTI over this month reports she had a negative urine culture just this week, no active urinary symptoms.  I doubt this is a UTI.  Likewise no chest pain or pressure to suggest ACS.  No tachycardia or dyspnea to suggest PE.  Prefer to go home at this point.  I think this is reasonable.  Advised that she keep a walker in the house next to her bed, and take her time getting up in the morning.  Clinical Course as of 05/14/21 1720  Wed May 14, 2021  1247 70 yo female presenting with dizziness, worse with standing, for several weeks.  Family doc referred to ED for dehydration evaluation and CVA rule out.  Pt transferred from [place] for [place] brain.  Pt received 1 L IVF and antivert and decadron for mild dehydration and vertigo/mild ataxia - sent for CVA rule out on MRI. [MT]  1255 IMPRESSION: No acute or reversible intracranial finding. Few foci of abnormal T2 and FLAIR signal within the white matter of the forceps major that could represent a manifestation of early/mild chronic small vessel disease or could relate to prenatal or perinatal insult.   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 [MT]    Clinical Course User Index [MT] Renaye Rakers Kermit Balo, MD   Final Clinical Impression(s) / ED Diagnoses Final diagnoses:  Weakness  Dehydration    Rx / DC Orders ED Discharge Orders          Ordered    Ambulatory referral to Neurology       Comments: Dizziness   05/14/21 0947    meclizine (ANTIVERT) 25 MG tablet  3 times daily PRN        05/14/21 1315             Terald Sleeper, MD 05/14/21 1720

## 2021-05-14 NOTE — ED Provider Notes (Signed)
[facility] EMERGENCY DEPARTMENT Provider Note   CSN: [id number] Arrival date & time: 05/14/21  6433     History Chief Complaint  Patient presents with   Weakness    fatigue    Angel Cherry is a 70 y.o. female.  70 yo F with a chief complaints of dizziness.  This been going on for greater than a week.  Seems to be worse when she stands up and tries to ambulate.  He has to reach out for objects to maintain her balance.  She denies headache denies head injury denies neck pain.  Denies one-sided numbness or weakness denies difficulty speech or swallowing.  She has had some nausea and has had some decreased oral intake over the past few days.  She has had symptoms like this previously and she was told that she was dehydrated.  She at that time had a coming to the hospital and got IV fluids and stayed for a few days.  Saw her family doctor yesterday and was found to have a bump in her renal function and was sent here for IV fluids.  The history is provided by the patient.  Weakness Severity:  Moderate Onset quality:  Gradual Duration:  2 weeks Timing:  Constant Progression:  Worsening Chronicity:  New Relieved by:  Nothing Worsened by:  Nothing Ineffective treatments:  None tried Associated symptoms: dizziness   Associated symptoms: no arthralgias, no chest pain, no dysuria, no fever, no headaches, no myalgias, no nausea, no shortness of breath, no urgency and no vomiting       Past Medical History:  Diagnosis Date   Hypertension    TIA (transient ischemic attack)     There are no problems to display for this patient.   History reviewed. No pertinent surgical history.   OB History   No obstetric history on file.     No family history on file.  Social History   Tobacco Use   Smoking status: Never   Smokeless tobacco: Never  Substance Use Topics   Alcohol use: Not Currently    Home Medications Prior to Admission medications   Medication  Sig Start Date End Date Taking? Authorizing Provider  atorvastatin (LIPITOR) 20 MG tablet Take 20 mg by mouth daily.    [provider]  clopidogrel (PLAVIX) 75 MG tablet Take 75 mg by mouth daily.    [provider]  lisinopril (ZESTRIL) 20 MG tablet Take 20 mg by mouth daily.    [provider]  magnesium 30 MG tablet Take 200 mg by mouth 2 (two) times daily.    [provider]  metoprolol tartrate (LOPRESSOR) 25 MG tablet Take 25 mg by mouth 2 (two) times daily.    [provider]  montelukast (SINGULAIR) 10 MG tablet Take 10 mg by mouth at bedtime.    [provider]  pantoprazole (PROTONIX) 40 MG tablet Take 40 mg by mouth daily.    [provider]  traZODone (DESYREL) 150 MG tablet Take by mouth at bedtime.    [provider]    Allergies    Denture adhesive, Oxycodone, Terconazole, Daypro [oxaprozin], and Venlafaxine  Review of Systems   Review of Systems  Constitutional:  Negative for chills and fever.  HENT:  Negative for congestion and rhinorrhea.   Eyes:  Negative for redness and visual disturbance.  Respiratory:  Negative for shortness of breath and wheezing.   Cardiovascular:  Negative for chest pain and palpitations.  Gastrointestinal:  Negative for nausea and vomiting.  Genitourinary:  Negative for dysuria and urgency.  Musculoskeletal:  Negative for arthralgias and myalgias.  Skin:  Negative for pallor and wound.  Neurological:  Positive for dizziness and weakness. Negative for headaches.   Physical Exam Updated Vital Signs BP (!) 164/80 (BP Location: Left Arm)   Pulse 63   Temp 98.2 F (36.8 C) (Oral)   Resp 17   Ht 5\' 2"  (1.575 m)   Wt 59 kg   SpO2 99%   BMI 23.78 kg/m   Physical Exam Vitals and nursing note reviewed.  Constitutional:      General: She is not in acute distress.    Appearance: She is well-developed. She is not diaphoretic.  HENT:     Head: Normocephalic and  atraumatic.  Eyes:     Pupils: Pupils are equal, round, and reactive to light.  Cardiovascular:     Rate and Rhythm: Normal rate and regular rhythm.     Heart sounds: No murmur heard.   No friction rub. No gallop.  Pulmonary:     Effort: Pulmonary effort is normal.     Breath sounds: No wheezing or rales.  Abdominal:     General: There is no distension.     Palpations: Abdomen is soft.     Tenderness: There is no abdominal tenderness.  Musculoskeletal:        General: No tenderness.     Cervical back: Normal range of motion and neck supple.  Skin:    General: Skin is warm and dry.  Neurological:     Mental Status: She is alert and oriented to person, place, and time.     GCS: GCS eye subscore is 4. GCS verbal subscore is 5. GCS motor subscore is 6.     Cranial Nerves: Cranial nerves are intact.     Sensory: Sensation is intact.     Motor: Motor function is intact.     Comments: L sided fast going nystagmus.   No appreciable weakness.  Wavering heel-to-shin with the left lower extremity.  Psychiatric:        Behavior: Behavior normal.    ED Results / Procedures / Treatments   Labs (all labs ordered are listed, but only abnormal results are displayed) Labs Reviewed  CBC WITH DIFFERENTIAL/PLATELET - Abnormal; Notable for the following components:      Result Value   RBC 3.49 (*)    Hemoglobin 11.0 (*)    HCT 31.4 (*)    All other components within normal limits  BASIC METABOLIC PANEL - Abnormal; Notable for the following components:   Glucose, Bld 100 (*)    All other components within normal limits    EKG EKG Interpretation  Date/Time:  Wednesday May 14 2021 08:36:47 EDT Ventricular Rate:  57 PR Interval:  142 QRS Duration: 95 QT Interval:  428 QTC Calculation: 417 R Axis:   52 Text Interpretation: Sinus rhythm Probable anteroseptal infarct, old No old tracing to compare Confirmed by [id number] [phone]) on 05/14/2021 9:08:10 AM  Radiology MR BRAIN WO  CONTRAST  Result Date: 05/14/2021 CLINICAL DATA:  Nonspecific dizziness. Numbness of the lips. Tingling in the left hand. EXAM: MRI HEAD WITHOUT CONTRAST TECHNIQUE: Multiplanar, multiecho pulse sequences of the brain and surrounding structures were obtained without intravenous contrast. COMPARISON:  Head CT 09/17/2019.  MRI 09/18/2019. FINDINGS: Brain: Diffusion imaging does not show any acute or subacute infarction. No abnormality affects the brainstem or cerebellum. Cerebral hemispheres are normal except  for a few punctate foci of T2 and FLAIR signal within the white matter of the forceps major regions. No cortical or large vessel territory infarction. No mass lesion, hemorrhage, hydrocephalus or extra-axial collection. Vascular: Major vessels at the base of the brain show flow. Skull and upper cervical spine: Negative Sinuses/Orbits: Chronic inflammatory change of the left maxillary sinus. Orbits negative. Other: None IMPRESSION: No acute or reversible intracranial finding. Few foci of abnormal T2 and FLAIR signal within the white matter of the forceps major that could represent a manifestation of early/mild chronic small vessel disease or could relate to prenatal or perinatal insult. 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 Electronically Signed   By: Paulina Fusi M.D.   On: 05/14/2021 12:47    Procedures Procedures   Medications Ordered in ED Medications  sodium chloride 0.9 % bolus 1,000 mL (0 mLs Intravenous Stopped 05/14/21 1003)  meclizine (ANTIVERT) tablet 25 mg (25 mg Oral Given 05/14/21 0839)  dexamethasone (DECADRON) tablet 10 mg (10 mg Oral Given 05/14/21 3818)    ED Course  I have reviewed the triage vital signs and the nursing notes.  Pertinent labs & imaging results that were available during my care of the patient were reviewed by me and considered in my medical decision making (see chart for details).  Clinical Course as of 05/14/21 1254  Wed May 14, 2021   1247 70 yo female presenting with dizziness, worse with standing, for several weeks.  Family doc referred to ED for dehydration evaluation and CVA rule out.  Pt transferred from [place] for MRI brain.  Pt received 1 L IVF and antivert and decadron for mild dehydration and vertigo/mild ataxia - sent for CVA rule out on MRI. [MT]    Clinical Course User Index [MT] Renaye Rakers Kermit Balo, MD   MDM Rules/Calculators/A&P                           70 yo F with a cc of dizziness.  Going on for the past couple weeks.  She has had persistent symptoms and has had some difficulty with ambulation and has to hold onto walls when she walks.  She has seen her family doctor yesterday and there is some concern for stroke though she also was concerned about dehydration.  She received a bolus of IV fluids here without significant improvement of her symptoms.  She also had left-sided nystagmus which would be a bit atypical for dehydration she had some difficulty with coordination with her left lower extremity as well wavering heel-to-shin.  She was able to ambulate here but had to hold onto the bed.  I am concerned that there is a possibility of a stroke and so I spoke with the ED provider at [facility], Dr. Audley Hose who accept the patient in transfer.  The other possibility if the patient does not have a stroke that she could have labyrinthitis.  She has a con commitment upper respiratory infection that is been going on for a few weeks.  Had a recent negative COVID test.  I did give her a dose of Decadron that should cover for inflammation, it is reasonable I think to maybe start her on a course of antibiotics.  The patients results and plan were reviewed and discussed.   Any x-rays performed were independently reviewed by myself.   Differential diagnosis were considered with the presenting HPI.  Medications  sodium chloride 0.9 % bolus 1,000 mL (0  mLs Intravenous Stopped 05/14/21 1003)  meclizine (ANTIVERT) tablet 25 mg (25  mg Oral Given 05/14/21 0839)  dexamethasone (DECADRON) tablet 10 mg (10 mg Oral Given 05/14/21 0838)    Vitals:   05/14/21 0800 05/14/21 0812 05/14/21 0900 05/14/21 1106  BP: (!) 144/86  (!) 141/70 (!) 164/80  Pulse: (!) 59  (!) 54 63  Resp: 18  12 17   Temp: 98.2 F (36.8 C)   98.2 F (36.8 C)  TempSrc: Oral   Oral  SpO2: 98%  100% 99%  Weight:  59 kg    Height:  5\' 2"  (1.575 m)      Final diagnoses:  Weakness  Dehydration    Final Clinical Impression(s) / ED Diagnoses Final diagnoses:  Weakness  Dehydration    Rx / DC Orders ED Discharge Orders          Ordered    Ambulatory referral to Neurology       Comments: Dizziness   05/14/21 0947             , DO 05/14/21 1254

## 2021-05-14 NOTE — ED Triage Notes (Signed)
Feeling fatigued x 10 days, seen at pcp , labs , sent to ED for possible dehydration. Also reports lightheaded .

## 2021-05-14 NOTE — ED Notes (Signed)
Pt left the department ,  alert and oriented x 4, steady gait , stable and no pain .

## 2021-05-14 NOTE — ED Notes (Signed)
Report called to Clydie Braun , CER at [facility] , pt will transport via POV. IV secured.

## 2021-05-14 NOTE — ED Notes (Signed)
Pt to MRI

## 2021-05-14 NOTE — Discharge Instructions (Addendum)
Your MRI did not show signs of a stroke or brain lesion to explain your weakness.  Your blood tests were reassuring in the ER.    A referral was placed to the neurology clinic.  You should hear from them in 2 business days to set up an appointment.  If you do not please call the number above.  Please keep your walker next to your bed and take your time getting up, particularly when standing up after lying down for period of time.

## 2021-05-30 ENCOUNTER — Encounter: Payer: Self-pay | Admitting: Neurology

## 2021-08-01 ENCOUNTER — Ambulatory Visit: Payer: Medicare Other | Admitting: Neurology

## 2021-08-04 ENCOUNTER — Encounter: Payer: Self-pay | Admitting: Neurology

## 2021-08-04 ENCOUNTER — Ambulatory Visit: Payer: Medicare Other | Admitting: Neurology

## 2021-08-04 VITALS — BP 138/78 | HR 58 | Ht 62.0 in | Wt 129.0 lb

## 2021-08-04 DIAGNOSIS — Z8673 Personal history of transient ischemic attack (TIA), and cerebral infarction without residual deficits: Secondary | ICD-10-CM | POA: Diagnosis not present

## 2021-08-04 DIAGNOSIS — I10 Essential (primary) hypertension: Secondary | ICD-10-CM

## 2021-08-04 DIAGNOSIS — E7849 Other hyperlipidemia: Secondary | ICD-10-CM | POA: Diagnosis not present

## 2021-08-04 DIAGNOSIS — R42 Dizziness and giddiness: Secondary | ICD-10-CM

## 2021-08-04 MED ORDER — COQ10 200 MG PO CAPS: 1.0000 | ORAL_CAPSULE | Freq: Every day | ORAL | 1 refills | Status: AC

## 2021-08-04 NOTE — Progress Notes (Signed)
[facility] [address] [place]. [place] [id number] [phone]       OFFICE CONSULT NOTE  Ms. Angel HaiVicky N Cherry Date of Birth:  09/07/1950 Medical Record Number:  [id number]   Referring MD: Frederich BaldingMorgan Holland Payne, PA-C  Reason for Referral: Dizziness  HPI: Angel Cherry is a 71 year old lady seen today for initial office consultation visit for episode of dizziness and possible TIA.  History is obtained from the patient and review of electronic medical records and opossum reviewed available imaging films in PACS.  She has past medical history of hypertension, anemia and remote history of TIA.  She had an episode in October when she woke up she felt dizzy, fatigued, off balance, short of breath.  She had to hold onto objects while walking.  She denied any vertigo, nausea, vomiting, diplopia, focal extremity weakness numbness, headache or slurred speech.  She saw her primary care physician who [facility]  Did cardiac work-up which was mostly unremarkable except for mild mitral regurgitation.  Patient saw her primary care physician who ordered an MRI scan of the brain on 05/14/2021 which was fairly unremarkable except for mild changes of chronic small vessel disease.  She had lab work on 09/08/2019 which showed LDL cholesterol 80 mg percent hemoglobin A1c 5.2.  She does have remote history of TIA in 2016 when she had transient numbness involving the left face arm and leg in the setting of significantly elevated blood pressure.  She was admitted to the hospital and started on blood pressure medications her symptoms resolved in about an hour.  She did undergo MRI scan of the brain which was negative for acute stroke.  She has been on Plavix since then and tolerating it well without bleeding bruising or recurrent symptoms.  She is also on Lipitor 40 mg daily which is tolerating well.  Her blood pressure is well controlled on losartan and today it is 138/78.   She does not smoke.  Patient does however complain of muscle cramps and pains and myalgias in her feet.  She plans on having colonoscopy soon and has questions about stroke risk while holding Plavix.  ROS:   14 system review of systems is positive for dizziness, imbalance, leg cramps, muscle aches all other systems negative  PMH:  Past Medical History:  Diagnosis Date   Hypertension    TIA (transient ischemic attack)     Social History:  Social History   Socioeconomic History   Marital status: Married    Spouse name: Not on file   Number of children: Not on file   Years of education: Not on file   Highest education level: Not on file  Occupational History   Not on file  Tobacco Use   Smoking status: Never   Smokeless tobacco: Never  Substance and Sexual Activity   Alcohol use: Not Currently   Drug use: Not on file   Sexual activity: Not on file  Other Topics Concern   Not on file  Social History Narrative   Not on file   Social Determinants of Health   Financial Resource Strain: Not on file  Food Insecurity: Not on file  Transportation Needs: Not on file  Physical Activity: Not on file  Stress: Not on file  Social Connections: Not on file  Intimate Partner Violence: Not on file    Medications:   Current Outpatient Medications on File Prior to Visit  Medication Sig Dispense Refill   atorvastatin (  LIPITOR) 40 MG tablet Take 40 mg by mouth daily.     Cholecalciferol 1.25 MG (50000 UT) capsule Take 1 capsule by mouth once a week.     clopidogrel (PLAVIX) 75 MG tablet Take 75 mg by mouth daily.     montelukast (SINGULAIR) 10 MG tablet Take 10 mg by mouth at bedtime.     pantoprazole (PROTONIX) 40 MG tablet Take 40 mg by mouth daily.     predniSONE (DELTASONE) 10 MG tablet Take 1 tablet by mouth daily.     tiZANidine (ZANAFLEX) 4 MG tablet Take 1 tablet by mouth every 6 (six) hours.     traZODone (DESYREL) 150 MG tablet Take by mouth at bedtime.     No current  facility-administered medications on file prior to visit.    Allergies:   Allergies  Allergen Reactions   Denture Adhesive Other (See Comments)    Takes skin off Takes skin off   Metoprolol     Other reaction(s): Other SOB (no anaphylaxis / allergy), beta blocker intolerance   SOB (no anaphylaxis / allergy), beta blocker intolerance   SOB (no anaphylaxis / allergy), beta blocker intolerance   SOB (no anaphylaxis / allergy), beta blocker intolerance      Oxycodone Nausea And Vomiting   Terconazole Rash   Daypro [Oxaprozin]    Venlafaxine Nausea Only    Physical Exam General: Frail middle-aged lady, seated, in no evident distress Head: head normocephalic and atraumatic.   Neck: supple with no carotid or supraclavicular bruits Cardiovascular: regular rate and rhythm, no murmurs Musculoskeletal: no deformity Skin:  no rash/petichiae Vascular:  Normal pulses all extremities  Neurologic Exam Mental Status: Awake and fully alert. Oriented to place and time. Recent and remote memory intact. Attention span, concentration and fund of knowledge appropriate. Mood and affect appropriate.  Cranial Nerves: Fundoscopic exam reveals sharp disc margins. Pupils equal, briskly reactive to light. Extraocular movements full without nystagmus. Visual fields full to confrontation. Hearing intact. Facial sensation intact. Face, tongue, palate moves normally and symmetrically.  Motor: Normal bulk and tone. Normal strength in all tested extremity muscles. Sensory.: intact to touch , pinprick , position and vibratory sensation.  Coordination: Rapid alternating movements normal in all extremities. Finger-to-nose and heel-to-shin performed accurately bilaterally. Gait and Station: Arises from chair without difficulty. Stance is normal. Gait demonstrates normal stride length and balance . Able to heel, toe and tandem walk without difficulty.  Reflexes: 1+ and symmetric. Toes downgoing.   NIHSS  0 Modified  Rankin  1   ASSESSMENT: 71 year old female with remote history of right hemispheric TIA in 2016 likely due to small vessel disease in setting of hypertensive urgency.  Recent episode of dizziness and off-balance in the setting of feeling significantly fatigued and weak of unclear significance in October 2022.  Vascular risk factors of hypertension, hyperlipidemia ,age and cerebrovascular disease     PLAN: I had a long d/w patient about her recent episode of dizziness and h/o TIA.  risk for recurrent stroke/TIAs, personally independently reviewed imaging studies and stroke evaluation results and answered questions.Continue clopidogrel 75 mg daily  for secondary stroke prevention and maintain strict control of hypertension with blood pressure goal below 130/90, diabetes with hemoglobin A1c goal below 6.5% and lipids with LDL cholesterol goal below 70 mg/dL. I also advised the patient to eat a healthy diet with plenty of whole grains, cereals, fruits and vegetables, exercise regularly and maintain ideal body weight . Check lipid profile, HbA1c, MRA neck and brain.  Start CoQ10 200 mg daily for her statin myalgias and cramps.She is neurologically cleared to undergo colonoscopy and may hold Plavix 3-5 days prior to the procedure with a small but acceptable periproceduarl risk of TIA/stroke if willing.Followup in the future with me in 3-4 months or call earlier if needed.  Greater than 50% time during this 50-minute consultation visit was spent on counseling and coordination of care about her history of TIA dizziness and discussion about plan for evaluation and treatment and answering questions.  Delia Heady, MD Note: This document was prepared with digital dictation and possible smart phrase technology. Any transcriptional errors that result from this process are unintentional.

## 2021-08-04 NOTE — Patient Instructions (Signed)
I had a long d/w patient about her recent episode of dizziness and h/o TIA.  risk for recurrent stroke/TIAs, personally independently reviewed imaging studies and stroke evaluation results and answered questions.Continue clopidogrel 75 mg daily  for secondary stroke prevention and maintain strict control of hypertension with blood pressure goal below 130/90, diabetes with hemoglobin A1c goal below 6.5% and lipids with LDL cholesterol goal below 70 mg/dL. I also advised the patient to eat a healthy diet with plenty of whole grains, cereals, fruits and vegetables, exercise regularly and maintain ideal body weight . Check lipid profile, HbA1c, MRA neck and brain. Start CoQ10 200 mg daily for her statin myalgias and cramps.She is neurologically cleared to undergo colonoscopy and may hold Plavix 3-5 days prior to the procedure with a small but acceptable periproceduarl risk of TIA/stroke if willing.Followup in the future with me in 3-4 months or call earlier if needed. Stroke Prevention Some medical conditions and behaviors can lead to a higher chance of having a stroke. You can help prevent a stroke by eating healthy, exercising, not smoking, and managing any medical conditions you have. Stroke is a leading cause of functional impairment. Primary prevention is particularly important because a majority of strokes are first-time events. Stroke changes the lives of not only those who experience a stroke but also their family and other caregivers. How can this condition affect me? A stroke is a medical emergency and should be treated right away. A stroke can lead to brain damage and can sometimes be life-threatening. If a person gets medical treatment right away, there is a better chance of surviving and recovering from a stroke. What can increase my risk? The following medical conditions may increase your risk of a stroke: Cardiovascular disease. High blood pressure (hypertension). Diabetes. High  cholesterol. Sickle cell disease. Blood clotting disorders (hypercoagulable state). Obesity. Sleep disorders (obstructive sleep apnea). Other risk factors include: Being older than age 52. Having a history of blood clots, stroke, or mini-stroke (transient ischemic attack, TIA). Genetic factors, such as race, ethnicity, or a family history of stroke. Smoking cigarettes or using other tobacco products. Taking birth control pills, especially if you also use tobacco. Heavy use of alcohol or drugs, especially cocaine and methamphetamine. Physical inactivity. What actions can I take to prevent this? Manage your health conditions High cholesterol levels. Eating a healthy diet is important for preventing high cholesterol. If cholesterol cannot be managed through diet alone, you may need to take medicines. Take any prescribed medicines to control your cholesterol as told by your health care provider. Hypertension. To reduce your risk of stroke, try to keep your blood pressure below 130/80. Eating a healthy diet and exercising regularly are important for controlling blood pressure. If these steps are not enough to manage your blood pressure, you may need to take medicines. Take any prescribed medicines to control hypertension as told by your health care provider. Ask your health care provider if you should monitor your blood pressure at home. Have your blood pressure checked every year, even if your blood pressure is normal. Blood pressure increases with age and some medical conditions. Diabetes. Eating a healthy diet and exercising regularly are important parts of managing your blood sugar (glucose). If your blood sugar cannot be managed through diet and exercise, you may need to take medicines. Take any prescribed medicines to control your diabetes as told by your health care provider. Get evaluated for obstructive sleep apnea. Talk to your health care provider about getting a sleep evaluation  if  you snore a lot or have excessive sleepiness. Make sure that any other medical conditions you have, such as atrial fibrillation or atherosclerosis, are managed. Nutrition Follow instructions from your health care provider about what to eat or drink to help manage your health condition. These instructions may include: Reducing your daily calorie intake. Limiting how much salt (sodium) you use to 1,500 milligrams (mg) each day. Using only healthy fats for cooking, such as olive oil, canola oil, or sunflower oil. Eating healthy foods. You can do this by: Choosing foods that are high in fiber, such as whole grains, and fresh fruits and vegetables. Eating at least 5 servings of fruits and vegetables a day. Try to fill one-half of your plate with fruits and vegetables at each meal. Choosing lean protein foods, such as lean cuts of meat, poultry without skin, fish, tofu, beans, and nuts. Eating low-fat dairy products. Avoiding foods that are high in sodium. This can help lower blood pressure. Avoiding foods that have saturated fat, trans fat, and cholesterol. This can help prevent high cholesterol. Avoiding processed and prepared foods. Counting your daily carbohydrate intake.  Lifestyle If you drink alcohol: Limit how much you have to: 0-1 drink a day for women who are not pregnant. 0-2 drinks a day for men. Know how much alcohol is in your drink. In the [place] one drink equals one 12 oz bottle of beer ( ), one 5 oz glass of wine ( ), or one 1 oz glass of hard liquor (73mL). Do not use any products that contain nicotine or tobacco. These products include cigarettes, chewing tobacco, and vaping devices, such as e-cigarettes. If you need help quitting, ask your health care provider. Avoid secondhand smoke. Do not use drugs. Activity  Try to stay at a healthy weight. Get at least 30 minutes of exercise on most days, such as: Fast walking. Biking. Swimming. Medicines Take  over-the-counter and prescription medicines only as told by your health care provider. Aspirin or blood thinners (antiplatelets or anticoagulants) may be recommended to reduce your risk of forming blood clots that can lead to stroke. Avoid taking birth control pills. Talk to your health care provider about the risks of taking birth control pills if: You are over 55 years old. You smoke. You get very bad headaches. You have had a blood clot. Where to find more information American Stroke Association: www.strokeassociation.org Get help right away if: You or a loved one has any symptoms of a stroke. "BE FAST" is an easy way to remember the main warning signs of a stroke: B - Balance. Signs are dizziness, sudden trouble walking, or loss of balance. E - Eyes. Signs are trouble seeing or a sudden change in vision. F - Face. Signs are sudden weakness or numbness of the face, or the face or eyelid drooping on one side. A - Arms. Signs are weakness or numbness in an arm. This happens suddenly and usually on one side of the body. S - Speech. Signs are sudden trouble speaking, slurred speech, or trouble understanding what people say. T - Time. Time to call emergency services. Write down what time symptoms started. You or a loved one has other signs of a stroke, such as: A sudden, severe headache with no known cause. Nausea or vomiting. Seizure. These symptoms may represent a serious problem that is an emergency. Do not wait to see if the symptoms will go away. Get medical help right away. Call your local emergency services (911 in the [place]  Do not drive yourself to the hospital. Summary You can help to prevent a stroke by eating healthy, exercising, not smoking, limiting alcohol intake, and managing any medical conditions you may have. Do not use any products that contain nicotine or tobacco. These include cigarettes, chewing tobacco, and vaping devices, such as e-cigarettes. If you need help quitting,  ask your health care provider. Remember "BE FAST" for warning signs of a stroke. Get help right away if you or a loved one has any of these signs. This information is not intended to replace advice given to you by your health care provider. Make sure you discuss any questions you have with your health care provider. Document Revised: 02/12/2020 Document Reviewed: 02/12/2020 Elsevier Patient Education  2022 [employer].

## 2021-08-05 LAB — HEMOGLOBIN A1C
Est. average glucose Bld gHb Est-mCnc: 123 mg/dL
Hgb A1c MFr Bld: 5.9 % — ABNORMAL HIGH (ref 4.8–5.6)

## 2021-08-05 LAB — LIPID PANEL
Chol/HDL Ratio: 3.1 ratio (ref 0.0–4.4)
Cholesterol, Total: 182 mg/dL (ref 100–199)
HDL: 59 mg/dL (ref >39)
LDL Chol Calc (NIH): 93 mg/dL (ref 0–99)
Triglycerides: 174 mg/dL — ABNORMAL HIGH (ref 0–149)
VLDL Cholesterol Cal: 30 mg/dL (ref 5–40)

## 2021-08-06 ENCOUNTER — Telehealth: Payer: Self-pay | Admitting: *Deleted

## 2021-08-06 MED ORDER — ATORVASTATIN CALCIUM 80 MG PO TABS: 80.0000 mg | ORAL_TABLET | Freq: Every day | ORAL | 5 refills | Status: DC

## 2021-08-06 NOTE — Addendum Note (Signed)
Addended by: Ann Maki on: 08/06/2021 08:11 AM   Modules accepted: Orders

## 2021-08-06 NOTE — Telephone Encounter (Signed)
I called pt and relayed results of lab testing. Pt verbalized understanding and is agreeable to starting the 80 mg Atorvastatin. Rx has been sent 80 mg # 30 with 5 refills. Pt will keep f/u as schedule for May.

## 2021-08-06 NOTE — Telephone Encounter (Signed)
-----   Message from Garvin Fila, MD sent at 08/05/2021  4:01 PM EST ----- Kindly inform the patient that cholesterol profile is not satisfactory.  Both triglycerides and bad cholesterol are above the goal hence recommend increase the dose of Lipitor to 80 mg daily. ----- Message ----- From: Interface, [facility] Lab Results In Sent: 08/05/2021   5:36 AM EST To: Garvin Fila, MD

## 2021-08-15 ENCOUNTER — Ambulatory Visit
Admission: RE | Admit: 2021-08-15 | Discharge: 2021-08-15 | Disposition: A | Discharge status: Home / Self Care | Payer: Medicare Other | Source: Ambulatory Visit | Attending: Neurology | Admitting: Neurology

## 2021-08-15 DIAGNOSIS — R42 Dizziness and giddiness: Secondary | ICD-10-CM | POA: Diagnosis not present

## 2021-08-15 DIAGNOSIS — Z8673 Personal history of transient ischemic attack (TIA), and cerebral infarction without residual deficits: Secondary | ICD-10-CM | POA: Diagnosis not present

## 2021-08-15 MED ORDER — GADOBENATE DIMEGLUMINE 529 MG/ML IV SOLN
11.0000 mL | Freq: Once | INTRAVENOUS | Status: AC | PRN
  Administered 2021-08-15: 11 mL via INTRAVENOUS

## 2021-08-21 NOTE — Progress Notes (Signed)
MR angiogram study of the blood vessels in the neck shows no major blockages to worry about and MR angiogram study of the blood vessels in the brain shows a couple of areas of minor narrowing of the blood vessels towards the back on the top and on the back of the neck but no need for any surgery or stent at this time.  Continue medical management.

## 2021-10-17 ENCOUNTER — Other Ambulatory Visit: Payer: Self-pay | Admitting: Cardiovascular Disease

## 2021-10-17 ENCOUNTER — Other Ambulatory Visit (HOSPITAL_COMMUNITY): Payer: Self-pay | Admitting: Cardiovascular Disease

## 2021-10-17 DIAGNOSIS — Z8249 Family history of ischemic heart disease and other diseases of the circulatory system: Secondary | ICD-10-CM

## 2021-10-31 ENCOUNTER — Ambulatory Visit (HOSPITAL_COMMUNITY)
Admission: RE | Admit: 2021-10-31 | Discharge: 2021-10-31 | Disposition: A | Discharge status: Home / Self Care | Payer: Self-pay | Source: Ambulatory Visit | Attending: Cardiovascular Disease | Admitting: Cardiovascular Disease

## 2021-10-31 DIAGNOSIS — Z8249 Family history of ischemic heart disease and other diseases of the circulatory system: Secondary | ICD-10-CM | POA: Insufficient documentation

## 2021-12-01 ENCOUNTER — Telehealth: Payer: Self-pay | Admitting: Neurology

## 2021-12-01 NOTE — Telephone Encounter (Signed)
Rescheduled 5/17 appt with pt over the phone- Dr. Leonie Man out. ?

## 2021-12-10 ENCOUNTER — Ambulatory Visit: Payer: Medicare Other | Admitting: Neurology

## 2021-12-11 ENCOUNTER — Ambulatory Visit: Payer: Medicare Other | Admitting: Neurology

## 2022-01-21 ENCOUNTER — Ambulatory Visit: Payer: Medicare Other | Admitting: Neurology

## 2022-01-28 ENCOUNTER — Telehealth: Payer: Self-pay | Admitting: Neurology

## 2022-01-28 NOTE — Telephone Encounter (Signed)
Rescheduled 9/20 appointment with pt over the phone - MD out

## 2022-04-15 ENCOUNTER — Ambulatory Visit: Payer: Medicare Other | Admitting: Neurology

## 2022-04-28 ENCOUNTER — Ambulatory Visit: Payer: Medicare Other | Admitting: Neurology

## 2022-04-28 VITALS — BP 178/75 | HR 53 | Ht 62.0 in | Wt 136.0 lb

## 2022-04-28 DIAGNOSIS — E7849 Other hyperlipidemia: Secondary | ICD-10-CM | POA: Diagnosis not present

## 2022-04-28 DIAGNOSIS — Z8673 Personal history of transient ischemic attack (TIA), and cerebral infarction without residual deficits: Secondary | ICD-10-CM

## 2022-04-28 NOTE — Progress Notes (Unsigned)
[facility] [address] [place]. [place] [address] [phone]       OFFICE FOLLOW-UP VISIT NOTE  Angel Cherry Date of Birth:  [birth year] Medical Record Number:  [id number]   Referring MD: Marianna Payment, PA-C  Reason for Referral: Dizziness  HPI: Initial visit 08/04/2021 :[name] is a 71 year old lady seen today for initial office consultation visit for episode of dizziness and possible TIA.  History is obtained from the patient and review of electronic medical records and opossum reviewed available imaging films in PACS.  She has past medical history of hypertension, anemia and remote history of TIA.  She had an episode in October when she woke up she felt dizzy, fatigued, off balance, short of breath.  She had to hold onto objects while walking.  She denied any vertigo, nausea, vomiting, diplopia, focal extremity weakness numbness, headache or slurred speech.  She saw her primary care physician who referred her to a cardiologist at [facility].  Did cardiac work-up which was mostly unremarkable except for mild mitral regurgitation.  Patient saw her primary care physician who ordered an MRI scan of the brain on 05/14/2021 which was fairly unremarkable except for mild changes of chronic small vessel disease.  She had lab work on 09/08/2019 which showed LDL cholesterol 80 mg percent hemoglobin A1c 5.2.  She does have remote history of TIA in 2016 when she had transient numbness involving the left face arm and leg in the setting of significantly elevated blood pressure.  She was admitted to the hospital and started on blood pressure medications her symptoms resolved in about an hour.  She did undergo MRI scan of the brain which was negative for acute stroke.  She has been on Plavix since then and tolerating it well without bleeding bruising or recurrent symptoms.  She is also on Lipitor 40 mg daily which is tolerating well.  Her blood pressure is well controlled on  losartan and today it is 138/78.  She does not smoke.  Patient does however complain of muscle cramps and pains and myalgias in her feet.  She plans on having colonoscopy soon and has questions about stroke risk while holding Plavix. Update 04/28/2022: She returns for follow-up much delayed follow-up visit following initial consultation in January 2023.  A follow-up visit was rescheduled twice in schedules.  Patient states she is doing well.  She has had no recurrent episodes of dizziness or any other kinds of TIA or strokelike symptoms.  She remains on Plavix which is tolerating well with minor bruising but no bleeding episodes.  Her blood pressure at home is quite well controlled with systolics in the 767 range but today blood pressure is elevated in office at 178/75.  I checked a lipid profile at last visit and LDL was elevated at 93 follow-up lipid profile was yet unsatisfactory so her primary care physician switched her from Lipitor to Crestor 40 mg daily which she is tolerating well without muscle aches and pains.  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     ROS:   14 system review of systems is positive for dizziness, imbalance, leg cramps, muscle aches all other systems negative  PMH:  Past Medical History:  Diagnosis Date   Hypertension    TIA (transient ischemic attack)     Social History:  Social History   Socioeconomic History   Marital status: Married    Spouse name: Not on file   Number of children: Not on file   Years of education: Not on file   Highest education level: Not on file  Occupational History   Not on file  Tobacco Use   Smoking status: Never   Smokeless tobacco: Never  Substance and Sexual Activity   Alcohol use: Not Currently   Drug use: Not on file   Sexual activity: Not on file  Other Topics Concern   Not on file   Social History Narrative   Not on file   Social Determinants of Health   Financial Resource Strain: Not on file  Food Insecurity: Not on file  Transportation Needs: Not on file  Physical Activity: Not on file  Stress: Not on file  Social Connections: Not on file  Intimate Partner Violence: Not on file    Medications:   Current Outpatient Medications on File Prior to Visit  Medication Sig Dispense Refill   Cholecalciferol 1.25 MG (50000 UT) capsule Take 1 capsule by mouth once a week.     clopidogrel (PLAVIX) 75 MG tablet Take 75 mg by mouth daily.     Coenzyme Q10 (COQ10) 200 MG CAPS Take 1 capsule by mouth daily. 1 capsule 1   irbesartan (AVAPRO) 150 MG tablet Take by mouth.     montelukast (SINGULAIR) 10 MG tablet Take 10 mg by mouth at bedtime.     oxybutynin (DITROPAN) 5 MG tablet Take by mouth.     pantoprazole (PROTONIX) 40 MG tablet Take 40 mg by mouth daily.     predniSONE (DELTASONE) 10 MG tablet Take 1 tablet by mouth daily.     rosuvastatin (CRESTOR) 40 MG tablet Take by mouth.     tiZANidine (ZANAFLEX) 4 MG tablet Take 1 tablet by mouth every 6 (six) hours.     traZODone (DESYREL) 150 MG tablet Take by mouth at bedtime.     No current facility-administered medications on file prior to visit.    Allergies:   Allergies  Allergen Reactions   Denture Adhesive Other (See Comments)    Takes skin off Takes skin off   Metoprolol     Other reaction(s): Other SOB (no anaphylaxis / allergy), beta blocker intolerance   SOB (no anaphylaxis / allergy), beta blocker intolerance   SOB (no anaphylaxis / allergy), beta blocker intolerance   SOB (no anaphylaxis / allergy), beta blocker intolerance      Oxycodone Nausea And Vomiting   Terconazole Rash   Daypro [Oxaprozin]    Venlafaxine Nausea Only    Physical Exam General: Frail middle-aged lady, seated, in no evident distress Head: head normocephalic and atraumatic.   Neck: supple with no carotid or supraclavicular  bruits Cardiovascular: regular rate and rhythm, no murmurs Musculoskeletal: no deformity Skin:  no rash/petichiae Vascular:  Normal pulses all extremities  Neurologic Exam Mental Status: Awake and fully alert. Oriented to place and time. Recent and remote memory intact. Attention span, concentration and fund of knowledge appropriate. Mood and affect appropriate.  Cranial Nerves: Fundoscopic exam not done. Pupils equal, briskly reactive to light. Extraocular movements full without nystagmus. Visual fields full to confrontation. Hearing intact. Facial sensation intact. Face, tongue, palate moves normally and symmetrically.  Motor: Normal bulk and tone. Normal strength in all tested extremity muscles. Sensory.: intact to touch , pinprick , position and vibratory sensation.  Coordination: Rapid alternating movements normal in all extremities. Finger-to-nose and  heel-to-shin performed accurately bilaterally. Gait and Station: Arises from chair without difficulty. Stance is normal. Gait demonstrates normal stride length and balance . Able to heel, toe and tandem walk with slight difficulty.  Reflexes: 1+ and symmetric. Toes downgoing.      ASSESSMENT: 71 year old female with remote history of right hemispheric TIA in 2016 likely due to small vessel disease in setting of hypertensive urgency.  Recent episode of dizziness and off-balance in the setting of feeling significantly fatigued and weak of unclear significance in October 2022.  Vascular risk factors of hypertension, hyperlipidemia ,age and cerebrovascular disease     PLAN: I had a long d/w patient about her  episode of dizziness  in Oct 2022 and h/o TIA.  Discussed results of MRI and lab work, risk for recurrent stroke/TIAs, personally independently reviewed imaging studies and stroke evaluation results and answered questions.Continue clopidogrel 75 mg daily  for secondary stroke prevention and maintain strict control of hypertension with blood  pressure goal below 130/90, diabetes with hemoglobin A1c goal below 6.5% and lipids with LDL cholesterol goal below 70 mg/dL. I also advised the patient to eat a healthy diet with plenty of whole grains, cereals, fruits and vegetables, exercise regularly and maintain ideal body weight . Check lipid profile, in 1 month and if not satisfactory consider starting PCSK9 inhibitor injections.The current medical regimen is effective;  continue present plan and medications. Continue CoQ10 200 mg daily for her statin myalgias and cramps.  Return for follow-up in the future only as needed and no schedule appointment was made.  Greater than 50% time during this 35-minute   visit was spent on counseling and coordination of care about her history of TIA dizziness and discussion about plan for evaluation and treatment and answering questions.  Antony Contras, MD Note: This document was prepared with digital dictation and possible smart phrase technology. Any transcriptional errors that result from this process are unintentional.

## 2022-04-28 NOTE — Patient Instructions (Signed)
I had a long d/w patient about her  episode of dizziness  in Oct 2022 and h/o TIA.  Discussed results of MRI and lab work, risk for recurrent stroke/TIAs, personally independently reviewed imaging studies and stroke evaluation results and answered questions.Continue clopidogrel 75 mg daily  for secondary stroke prevention and maintain strict control of hypertension with blood pressure goal below 130/90, diabetes with hemoglobin A1c goal below 6.5% and lipids with LDL cholesterol goal below 70 mg/dL. I also advised the patient to eat a healthy diet with plenty of whole grains, cereals, fruits and vegetables, exercise regularly and maintain ideal body weight . Check lipid profile, in 1 month and if not satisfactory consider starting PCSK9 inhibitor injections.The current medical regimen is effective;  continue present plan and medications. Continue CoQ10 200 mg daily for her statin myalgias and cramps.  Return for follow-up in the future only as needed and no schedule appointment was made.

## 2022-07-28 ENCOUNTER — Telehealth: Payer: Self-pay | Admitting: Neurology

## 2022-07-28 NOTE — Telephone Encounter (Signed)
Pt states she began having numbness in her right leg on yesterday and she has had numbness in her right foot for several months, please call to discuss.

## 2022-07-28 NOTE — Telephone Encounter (Signed)
I reviewed chart, pt has been following [facility] for theses symptoms and treatment.   Please have pt reach out to [facility]  for further work up.

## 2022-07-28 NOTE — Telephone Encounter (Signed)
I called pt and LVM ok per DPR and informed her to call [facility] for further work up and if she had any questions to call office back.

## 2022-09-22 ENCOUNTER — Encounter: Payer: Self-pay | Admitting: Cardiology

## 2022-09-22 ENCOUNTER — Ambulatory Visit: Payer: Medicare HMO | Attending: Cardiology | Admitting: Cardiology

## 2022-09-22 VITALS — BP 174/82 | HR 46 | Ht 62.0 in | Wt 136.6 lb

## 2022-09-22 DIAGNOSIS — I10 Essential (primary) hypertension: Secondary | ICD-10-CM | POA: Diagnosis not present

## 2022-09-22 DIAGNOSIS — R000 Tachycardia, unspecified: Secondary | ICD-10-CM | POA: Diagnosis not present

## 2022-09-22 DIAGNOSIS — R002 Palpitations: Secondary | ICD-10-CM | POA: Diagnosis not present

## 2022-09-22 DIAGNOSIS — Z8673 Personal history of transient ischemic attack (TIA), and cerebral infarction without residual deficits: Secondary | ICD-10-CM | POA: Diagnosis not present

## 2022-09-22 MED ORDER — IRBESARTAN 150 MG PO TABS: 150.0000 mg | ORAL_TABLET | Freq: Every day | ORAL | 3 refills | Status: DC

## 2022-09-22 NOTE — Patient Instructions (Addendum)
Medication Instructions:  Your physician has recommended you make the following change in your medication:  1) RESTART irbesartan 150 mg daily  *If you need a refill on your cardiac medications before your next appointment, please call your pharmacy*  Lab Work: CBC prior to MRI If you have labs (blood work) drawn today and your tests are completely normal, you will receive your results only by: Ruby (if you have MyChart) OR A paper copy in the mail If you have any lab test that is abnormal or we need to change your treatment, we will call you to review the results.   Testing/Procedures: Your physician has requested that you have a cardiac MRI. Cardiac MRI uses a computer to create images of your heart as its beating, producing both still and moving pictures of your heart and major blood vessels. For further information please visit [email or web address] Please follow the instruction sheet given to you today for more information.  Follow-Up: At [facility], you and your health needs are our priority.  As part of our continuing mission to provide you with exceptional heart care, we have created designated Provider Care Teams.  These Care Teams include your primary Cardiologist (physician) and Advanced Practice Providers (APPs -  Physician Assistants and Nurse Practitioners) who all work together to provide you with the care you need, when you need it.  Your next appointment:   8 week(s)  Provider:   Lars Mage, MD    Other Instructions    You are scheduled for Cardiac MRI on ______________. Please arrive for your appointment at ______________ ( arrive 30-45 minutes prior to test start time). ?  [facility] [address] [place], [place] [address] [phone] Please take advantage of the free valet parking available at the MAIN entrance (A entrance).  Proceed to the [facility] Radiology Department (First Floor) for check-in.   [facility] [place], [place] [address] [phone] Please take advantage of the free valet parking available at the MAIN entrance. Proceed to [facility] registration for check-in (first floor).  Magnetic resonance imaging (MRI) is a painless test that produces images of the inside of the body without using Xrays.  During an MRI, strong magnets and radio waves work together in a [employer], political party to form detailed images.   MRI images may provide more details about a medical condition than X-rays, CT scans, and ultrasounds can provide.  You may be given earphones to listen for instructions.  You may eat a light breakfast and take medications as ordered with the exception of furosemide, hydrochlorothiazide, or spironolactone(fluid pill, other). Please avoid stimulants for 12 hr prior to test. (Ie. Caffeine, nicotine, chocolate, or antihistamine medications)  If a contrast material will be used, an IV will be inserted into one of your veins. Contrast material will be injected into your IV. It will leave your body through your urine within a day. You may be told to drink plenty of fluids to help flush the contrast material out of your system.  You will be asked to remove all metal, including: Watch, jewelry, and other metal objects including hearing aids, hair pieces and dentures. Also wearable glucose monitoring systems (ie. Freestyle Libre and Omnipods) (Braces and fillings normally are not a problem.)   TEST WILL TAKE APPROXIMATELY 1 HOUR  PLEASE NOTIFY SCHEDULING AT LEAST 24 HOURS IN ADVANCE IF YOU ARE UNABLE TO KEEP YOUR APPOINTMENT. [phone]  Please call Marchia Bond,  cardiac imaging nurse navigator with any questions/concerns. Marchia Bond RN Navigator Cardiac Imaging Gordy Clement RN Navigator Cardiac Imaging [facility] [phone] Office

## 2022-09-22 NOTE — Progress Notes (Deleted)
  Electrophysiology Office Note:    Date:  09/22/2022   ID:  Angel Cherry, DOB 12/12/1950, MRN [id number]  [place] Cardiologist:  None  [facility] Electrophysiologist:  Vickie Epley, MD   Referring MD: Sue Lush, PA-C   Chief Complaint: Abnormal heart monitor  History of Present Illness:    Angel Cherry is a 72 y.o. female who presents for an evaluation of an abnormal heart monitor at the request of Angel Coop, PA-C.  Patient has a medical history that includes possible TIA and hypertension.  She was seen by [facility] for tachycardia.  A heart monitor showed a 13 beat run of NSVT on February 4.       Their past medical, social and family history was reveiwed.   ROS:   Please see the history of present illness.    All other systems reviewed and are negative.  EKGs/Labs/Other Studies Reviewed:    The following studies were reviewed today:  EKG performed at [place] personally reviewed shows sinus bradycardia.  No PVCs.  Intervals appear to be normal.  No preexcitation.  Echo from 2022 in the [place] records shows normal EF, no significant valvular abnormalities  EKG:  The ekg ordered today demonstrates ***   Physical Exam:    VS:  There were no vitals taken for this visit.    Wt Readings from Last 3 Encounters:  04/28/22 136 lb (61.7 kg)  08/04/21 129 lb (58.5 kg)  05/14/21 130 lb (59 kg)     GEN: *** Well nourished, well developed in no acute distress CARDIAC: ***RRR, no murmurs, rubs, gallops RESPIRATORY:  Clear to auscultation without rales, wheezing or rhonchi       ASSESSMENT AND PLAN:    1. Tachycardia   2. Palpitations   3. History of TIA (transient ischemic attack)     #Tachycardia #Palpitations Patient recently wore a heart monitor that showed a 13 beat salvo of NSVT she has a structurally normal heart on recent echo.  EKG is within normal limits.  I would like to review the Holter monitor  results for now do not think additional workup is needed.  Given her baseline bradycardia, I do not think we have any room to add in beta-blocker.  #History of TIA Continue Plavix and statin.  Follows with Dr. Leonie Cherry.  Follow-up with EP on an as-needed basis.         Total time spent with patient today *** minutes. This includes reviewing records, evaluating the patient and coordinating care.     Signed, Angel Cherry. Quentin Ore, MD, [facility], [facility] 09/22/2022 4:39 AM    Electrophysiology [facility]

## 2022-09-22 NOTE — Progress Notes (Signed)
Electrophysiology Office Note:    Date:  09/22/2022   ID:  Angel Cherry, DOB Sep 16, 1950, MRN [id number]  [place] Cardiologist:  None  [facility] Electrophysiologist:  Vickie Epley, MD   Referring MD: Sue Lush, PA-C   Chief Complaint: Abnormal heart monitor  History of Present Illness:    Angel Cherry is a 72 y.o. female who presents for an evaluation of an abnormal heart monitor at the request of Tereasa Coop, PA-C.  Patient has a medical history that includes possible TIA and hypertension.  She was seen by [facility] for tachycardia.  A heart monitor showed a 13 beat run of NSVT on February 4.  Today, she states that initially she noticed a low heart rate in the 40's. At the time she was feeling fatigued and lightheaded. At other times, her heart rate had been higher including in the 90s. Due to her prior TIA her monitor was ordered and she was started on Nadolol. She denies side effects aside from fatigue.  She endorses a couple presyncopal spells that occur randomly. Typically she is aware of their onset: she becomes lightheaded and steadies herself. She feels as though she will pass out and everything goes black. She waits until her symptoms subside after a brief time.  In clinic today her BP is 174/82. She notes that her BP was more controlled on irbesartan. However, that had been discontinued since she was started on nadolol.  She denies any chest pain, shortness of breath, or peripheral edema. No headaches,  orthopnea, or PND.     Their past medical, social and family history was reviewed.   ROS:   Please see the history of present illness.    (+) Presyncope (+) Lightheadedness (+) Fatigue All other systems reviewed and are negative.  EKGs/Labs/Other Studies Reviewed:    The following studies were reviewed today:  EKG performed at [place] personally reviewed shows sinus bradycardia.  No PVCs.  Intervals appear to be  normal.  No preexcitation.  Echo from 2022 in the [place] records shows normal EF, no significant valvular abnormalities  [employer] monitor personally reviewed from [facility] shows 2 different morphology wide-complex tachycardias.  1 is 136 bpm and is regular.  Another is much wider with a ventricular rate of 193 bpm.   Physical Exam:    VS:  BP (!) 174/82   Pulse (!) 46   Ht '5\' 2"'$  (1.575 m)   Wt 136 lb 9.6 oz (62 kg)   SpO2 99%   BMI 24.98 kg/m     Wt Readings from Last 3 Encounters:  09/22/22 136 lb 9.6 oz (62 kg)  04/28/22 136 lb (61.7 kg)  08/04/21 129 lb (58.5 kg)     GEN: Well nourished, well developed in no acute distress CARDIAC: RRR, no murmurs, rubs, gallops RESPIRATORY:  Clear to auscultation without rales, wheezing or rhonchi       ASSESSMENT AND PLAN:    1. Tachycardia   2. Palpitations   3. History of TIA (transient ischemic attack)   4. Primary hypertension     #Tachycardia #Palpitations Patient recently wore a heart monitor that showed a 13 beat salvo of NSVT she has a structurally normal heart on recent echo.  EKG is within normal limits.  Tolerating nadolol without off target effects.  I would like to get a cardiac MRI to assess for any infiltrative disease, inflammation, LGE.  I will plan to see her back after  the cardiac MRI to review the results.  #Hypertension Above goal today.  I will have her restart her home irbesartan.  She will record blood pressures at home and bring them to the next appointment for [place] to review.  She had her kidney function checked in January of this year and it was normal.  #History of TIA Continue Plavix and statin.  Follows with Dr. Leonie Man.  Follow-up with EP on an as-needed basis.   I,Mathew Stumpf,acting as a Education administrator for Vickie Epley, MD.,have documented all relevant documentation on the behalf of Vickie Epley, MD,as directed by  Vickie Epley, MD while in the presence of Vickie Epley, MD.  I, Vickie Epley, MD, have reviewed all documentation for this visit. The documentation on 09/22/22 for the exam, diagnosis, procedures, and orders are all accurate and complete.   Signed, Hilton Cork. Quentin Ore, MD, [facility], [facility] 09/22/2022 2:03 PM    Electrophysiology [facility]

## 2022-09-23 ENCOUNTER — Encounter: Payer: Self-pay | Admitting: Cardiology

## 2022-10-05 ENCOUNTER — Ambulatory Visit: Payer: Medicare HMO | Attending: Cardiology

## 2022-10-05 DIAGNOSIS — R002 Palpitations: Secondary | ICD-10-CM

## 2022-10-05 DIAGNOSIS — Z8673 Personal history of transient ischemic attack (TIA), and cerebral infarction without residual deficits: Secondary | ICD-10-CM

## 2022-10-05 DIAGNOSIS — R000 Tachycardia, unspecified: Secondary | ICD-10-CM

## 2022-10-05 LAB — CBC
Hematocrit: 35.3 % (ref 34.0–46.6)
Hemoglobin: 11.9 g/dL (ref 11.1–15.9)
MCH: 30.8 pg (ref 26.6–33.0)
MCHC: 33.7 g/dL (ref 31.5–35.7)
MCV: 92 fL (ref 79–97)
Platelets: 208 10*3/uL (ref 150–450)
RBC: 3.86 x10E6/uL (ref 3.77–5.28)
RDW: 13.6 % (ref 11.7–15.4)
WBC: 5 10*3/uL (ref 3.4–10.8)

## 2022-10-07 ENCOUNTER — Encounter: Payer: Self-pay | Admitting: Cardiology

## 2022-10-07 DIAGNOSIS — I10 Essential (primary) hypertension: Secondary | ICD-10-CM

## 2022-10-12 ENCOUNTER — Telehealth: Payer: Self-pay | Admitting: *Deleted

## 2022-10-12 NOTE — Telephone Encounter (Signed)
   Pre-operative Risk Assessment    Patient Name: Angel Cherry  DOB: 10/15/1950 MRN: [id number]      Request for Surgical Clearance    Procedure:   EGD  Date of Surgery:  Clearance TBD                                 Surgeon:  DR. [place] or Practice Name:  [facility] Phone number:  [phone]  Fax number:  [phone]   Type of Clearance Requested:   - Medical  - Pharmacy:  Hold Clopidogrel (Plavix) X'S 5 DAYS   Type of Anesthesia:  Not Indicated   Additional requests/questions:    [name]   10/12/2022, 12:53 PM

## 2022-10-12 NOTE — Telephone Encounter (Signed)
Dr. Quentin Ore,  [place] saw this patient on 09/22/2022. Will you please comment on clearance for EGD (not yet scheduled)? You ordered a cardiac MRI, which is scheduled for 11/04/2022. Does she need to wait for results of that before she can proceed with EGD?  Please route your response to P CV DIV Preop. I will communicate with requesting office once you have given recommendations.   Thank you!  Mayra Reel, NP

## 2022-10-13 NOTE — Addendum Note (Signed)
Addended by: Bernestine Amass on: 10/13/2022 07:24 AM   Modules accepted: Orders

## 2022-10-16 NOTE — Telephone Encounter (Signed)
   Patient Name: Angel Cherry  DOB: 03/01/1951 MRN: [id number]  Primary Cardiologist: None  Chart reviewed as part of pre-operative protocol coverage. Pre-op clearance already addressed by colleagues in earlier phone notes. To summarize recommendations:  -OK to proceed with EGD. Thanks, Lars Mage   Okay to hold plavix x 5 days from a cardiac standpoint. Hx of TIA so would check with primary/neuro as well.  Will route this bundled recommendation to requesting provider via Epic fax function and remove from pre-op pool. Please call with questions.  Elgie Collard, PA-C 10/16/2022, 7:19 AM

## 2022-10-19 ENCOUNTER — Ambulatory Visit: Payer: Medicare HMO | Attending: Cardiovascular Disease | Admitting: Pharmacist

## 2022-10-19 VITALS — BP 168/72 | HR 46

## 2022-10-19 DIAGNOSIS — I10 Essential (primary) hypertension: Secondary | ICD-10-CM | POA: Diagnosis not present

## 2022-10-19 NOTE — Patient Instructions (Addendum)
Your blood pressure goal is < 130/1mmHg  Restart irbesartan at 1/2 tablet (75mg ) once daily in the morning  Decrease your nadolol to 1/2 tablet (10mg ) once daily in the evening  Continue to monitor your blood pressure and heart rate  I'll call you in about 2 weeks and see how your blood pressure and heart rate are looking

## 2022-10-19 NOTE — Progress Notes (Signed)
Patient ID: Angel Cherry                 DOB: Dec 21, 1950                      MRN: [id number]     HPI: Angel Cherry is a 72 y.o. female referred by Dr. Quentin Ore to HTN clinic. PMH is significant for HTN and possible TIA. First saw Dr Quentin Ore on 09/22/22. Pt was previously seen by [facility] for tachycardia, heart monitor showed 13 beat run of NSVT on 2/4. Pt had initially noticed low HR in the 40s, felt fatigued and lightheaded, other times with HR into the 90s, they started her on nadolol. She also reported presyncopal spells occurring randomly - would feel lightheaded, like she was going to pass out and everything would go black. BP elevated at 174/82, HR 46 at visit with Dr Quentin Ore on 09/22/22. Pt reported BP previously better controlled on irbesartan but that was stopped when she was started on nadolol. She was scheduled for cardiac MRI, advised to restart her irbesartan, and scheduled for PharmD f/u. Pt sent in message on 3/13 feeling lightheaded. Her BP was 140s/60s and her HR was in the upper 40s. She was advised by MD to stop her irbesartan and continue on nadolol.   Pt presents today for follow up. Has been experiencing fatigue. Also having trouble with her arthritis. Has followed with [facility] rheumatology until her provider retired. Previously took plaquenil which worked well, however her cardiologist and neurologist previously told her that it caused her TIA, although she reports her SBP was also ~200 at the time. Since then, had taken prednisone 10mg  daily which helped, but caused weight gain so she stopped this about 3 months ago. Avoids NSAIDs and Tylenol is ineffective. Generally feels like a mack truck hit her when she wakes up in the morning from her fatigue and pain. BP in the AM 135-140/60-70, then increases in the afternoon to SBP 160 or so. Had tried taking irbesartan in the AM and nadolol in the PM but still felt poorly on combo.  Current HTN meds: nadolol 20mg   daily Previously tried: metoprolol - SOB BP goal: <130/67mmHg  Family History: Mother with CHF and breast cancer, brother with cancer.  Social History: No alcohol, drug, or tobacco use.  Diet: Minimal caffeine. Avoids salts and sweets.  Exercise: hasn't been able to walk as much lately due to fatigue and pain  Wt Readings from Last 3 Encounters:  09/22/22 136 lb 9.6 oz (62 kg)  04/28/22 136 lb (61.7 kg)  08/04/21 129 lb (58.5 kg)   BP Readings from Last 3 Encounters:  09/22/22 (!) 174/82  04/28/22 (!) 178/75  08/04/21 138/78   Pulse Readings from Last 3 Encounters:  09/22/22 (!) 46  04/28/22 (!) 53  08/04/21 (!) 58    Renal function: CrCl cannot be calculated (Patient's most recent lab result is older than the maximum 21 days allowed.).  Past Medical History:  Diagnosis Date   Abdominal pain    Abnormal ECG    Acute respiratory infection    Anemia    Arthropathy    Body aches    Bradycardia    Bronchitis    Bruit    Cough    Cushingoid facies    Dizziness    Fatigue    Hematuria    Hyperlipidemia    Hypertension    IGT (impaired glucose tolerance)    Mitral  regurgitation    Neck muscle spasm    Osteoarthritis    Pancreatic cyst    Paronychia of finger    Pollen allergies    Pre-syncope    Right hand weakness    Serum total bilirubin elevated    Sinus congestion    Sinusitis    SOB (shortness of breath) on exertion    SSS (sick sinus syndrome) (HCC)    TIA (transient ischemic attack)    URI (upper respiratory infection)    UTI (urinary tract infection)    VT (ventricular tachycardia) (HCC)    Weight gain    Wheezing     Current Outpatient Medications on File Prior to Visit  Medication Sig Dispense Refill   Cholecalciferol 1.25 MG (50000 UT) capsule Take 1 capsule by mouth once a week.     clopidogrel (PLAVIX) 75 MG tablet Take 75 mg by mouth daily.     Coenzyme Q10 (COQ10) 200 MG CAPS Take 1 capsule by mouth daily. 1 capsule 1   montelukast  (SINGULAIR) 10 MG tablet Take 10 mg by mouth at bedtime.     nadolol (CORGARD) 20 MG tablet Take 20 mg by mouth daily.     oxybutynin (DITROPAN) 5 MG tablet Take by mouth.     pantoprazole (PROTONIX) 40 MG tablet Take 40 mg by mouth daily.     pregabalin (LYRICA) 25 MG capsule Take 25 mg by mouth daily.     rosuvastatin (CRESTOR) 40 MG tablet Take by mouth.     tiZANidine (ZANAFLEX) 4 MG tablet Take 1 tablet by mouth every 6 (six) hours.     traZODone (DESYREL) 150 MG tablet Take by mouth at bedtime.     No current facility-administered medications on file prior to visit.    Allergies  Allergen Reactions   Denture Adhesive Other (See Comments)    Takes skin off Takes skin off   Metoprolol     Other reaction(s): Other SOB (no anaphylaxis / allergy), beta blocker intolerance   SOB (no anaphylaxis / allergy), beta blocker intolerance   SOB (no anaphylaxis / allergy), beta blocker intolerance   SOB (no anaphylaxis / allergy), beta blocker intolerance      Oxycodone Nausea And Vomiting   Terconazole Rash   Daypro [Oxaprozin]    Venlafaxine Nausea Only     Assessment/Plan:  1. Hypertension - BP remains elevated above goal < 130/60mmHg, also remains bradycardic with HR in the 40s. Will decrease nadolol to 10mg  at night and restart irbesartan at lower dose of 75mg  in the morning. She'll monitor BP and HR at home and I'll call her in 2 weeks for an update. Also encouraged her to reach back out to rheumatology office at [facility] to see if they have other options to help control her arthritis as pain is affecting her activity level and likely her BP as well.  Brandonlee Navis E. Tikita Mabee, PharmD, BCACP, [employer]. [address], [place], [place] [address] Phone: [phone]; Fax: [phone] 10/19/2022 4:05 PM

## 2022-10-21 ENCOUNTER — Encounter: Payer: Self-pay | Admitting: Neurology

## 2022-10-26 ENCOUNTER — Telehealth: Payer: Self-pay

## 2022-10-26 NOTE — Telephone Encounter (Signed)
Faxed clearance letter for EGD

## 2022-11-02 ENCOUNTER — Telehealth: Payer: Self-pay | Admitting: Pharmacist

## 2022-11-02 NOTE — Telephone Encounter (Signed)
Called pt to f/u with BP. Resumed taking irbesartan 75mg  and decreased her nadolol to 10mg  at her last visit with me on 3/25 due to elevated BP but HR of 46. Since then, pt reported BP was running ~144/57. Now running higher at 160-170 since she's been having sinus issues - saw her MD this AM, reports sinuses were swollen and infected, prescribed Augmentin. Had only been taking Zyrtec OTC before. Has been taking an extra 75mg  irbesartan if needed for elevated BP. HR still in the 40s since decreasing her nadolol dose - reports range of 43-49, low of 39 at night when sleeping according to her watch report.  Advised pt to increase her irbesartan to 150mg  daily. She'll continue monitoring BP and I'll call for an update in 2 weeks.  Will forward message to MD to see if nadolol can be stopped due to continued bradycardia. This was started by [facility] for tachycardia and NVST on heart monitor in February. Has cardiac MRI this Wednesday.

## 2022-11-03 ENCOUNTER — Telehealth (HOSPITAL_COMMUNITY): Payer: Self-pay | Admitting: Emergency Medicine

## 2022-11-03 NOTE — Telephone Encounter (Signed)
Reaching out to patient to offer assistance regarding upcoming cardiac imaging study; pt verbalizes understanding of appt date/time, parking situation and where to check in, pre-test NPO status and medications ordered, and verified current allergies; name and call back number provided for further questions should they arise Rockwell Alexandria RN Navigator Cardiac Imaging [facility] [phone] office [phone] cell  Denies metal implants except shoulder replacements Denies clasutro Denies iv issues

## 2022-11-03 NOTE — Telephone Encounter (Signed)
Attempted to call patient regarding upcoming cardiac MR appointment. Left message on voicemail with name and callback number Kenlee Vogt RN Navigator Cardiac Imaging [facility] [phone] Office [phone] Cell  

## 2022-11-04 ENCOUNTER — Other Ambulatory Visit: Payer: Self-pay | Admitting: Cardiology

## 2022-11-04 ENCOUNTER — Ambulatory Visit
Admission: RE | Admit: 2022-11-04 | Discharge: 2022-11-04 | Disposition: A | Discharge status: Home / Self Care | Payer: Medicare HMO | Source: Ambulatory Visit | Attending: Cardiology | Admitting: Cardiology

## 2022-11-04 DIAGNOSIS — Z8673 Personal history of transient ischemic attack (TIA), and cerebral infarction without residual deficits: Secondary | ICD-10-CM | POA: Diagnosis not present

## 2022-11-04 DIAGNOSIS — R000 Tachycardia, unspecified: Secondary | ICD-10-CM

## 2022-11-04 DIAGNOSIS — R002 Palpitations: Secondary | ICD-10-CM | POA: Diagnosis not present

## 2022-11-04 MED ORDER — GADOBUTROL 1 MMOL/ML IV SOLN
9.0000 mL | Freq: Once | INTRAVENOUS | Status: AC | PRN
  Administered 2022-11-04: 9 mL via INTRAVENOUS

## 2022-11-26 ENCOUNTER — Telehealth: Payer: Medicare HMO | Admitting: Physician Assistant

## 2022-11-26 DIAGNOSIS — B9689 Other specified bacterial agents as the cause of diseases classified elsewhere: Secondary | ICD-10-CM | POA: Diagnosis not present

## 2022-11-26 DIAGNOSIS — J019 Acute sinusitis, unspecified: Secondary | ICD-10-CM

## 2022-11-26 MED ORDER — AMOXICILLIN-POT CLAVULANATE 875-125 MG PO TABS
1.0000 | ORAL_TABLET | Freq: Two times a day (BID) | ORAL | 0 refills | Status: DC
Start: 2022-11-26 — End: 2022-12-16

## 2022-11-26 MED ORDER — PREDNISONE 10 MG (21) PO TBPK
ORAL_TABLET | ORAL | 0 refills | Status: DC
Start: 2022-11-26 — End: 2022-12-16

## 2022-11-26 NOTE — Progress Notes (Signed)

## 2022-11-26 NOTE — Progress Notes (Deleted)
  Electrophysiology Office Follow up Visit Note:    Date:  11/27/2022   ID:  Angel Cherry, DOB Jan 29, 1951, MRN [id number]  PCP:  Nathaneil Canary, PA-C  [facility] Cardiologist:  None  [facility] Electrophysiologist:  Lanier Prude, MD    Interval History:    Angel Cherry is a 72 y.o. female who presents for a follow up visit.   Last seen 09/22/2022 for tachycardia. She had a monitor showing NSVT but a structurally normal heart on echo. She was taking nadolol. I ordered a cardiac MRI which was normal.        Past medical, surgical, social and family history were reviewed.  ROS:   Please see the history of present illness.    All other systems reviewed and are negative.  EKGs/Labs/Other Studies Reviewed:    The following studies were reviewed today:  11/04/2022 cMR 1. Normal LV and RV size and function.  LVEF = 69%.  2. There is no late gadolinium enhancement in the left ventricular myocardium.  3. Normal T1, T2, ECV values. No evidence for myocarditis or infiltrative disease.  4. No significant valvular abnormalities.     Physical Exam:    VS:  There were no vitals taken for this visit.    Wt Readings from Last 3 Encounters:  09/22/22 136 lb 9.6 oz (62 kg)  04/28/22 136 lb (61.7 kg)  08/04/21 129 lb (58.5 kg)     GEN: *** Well nourished, well developed in no acute distress CARDIAC: ***RRR, no murmurs, rubs, gallops RESPIRATORY:  Clear to auscultation without rales, wheezing or rhonchi       ASSESSMENT:    No diagnosis found. PLAN:    In order of problems listed above:   #NSVT Doing well on nadolol.  #Hypertension *** goal today.  Recommend checking blood pressures 1-2 times per week at home and recording the values.  Recommend bringing these recordings to the primary care physician.   Follow up w EP PRN.    Signed, Steffanie Dunn, MD, [facility], [facility] 11/27/2022 9:50 AM    Electrophysiology [facility]

## 2022-11-27 ENCOUNTER — Encounter: Payer: Self-pay | Admitting: Pharmacist

## 2022-11-27 ENCOUNTER — Encounter: Payer: Self-pay | Admitting: Cardiology

## 2022-11-27 ENCOUNTER — Ambulatory Visit: Payer: Medicare HMO | Attending: Cardiology | Admitting: Cardiology

## 2022-11-27 VITALS — BP 166/74 | HR 60 | Ht 62.0 in | Wt 141.2 lb

## 2022-11-27 DIAGNOSIS — I10 Essential (primary) hypertension: Secondary | ICD-10-CM

## 2022-11-27 DIAGNOSIS — R002 Palpitations: Secondary | ICD-10-CM

## 2022-11-27 MED ORDER — AMLODIPINE BESYLATE 5 MG PO TABS: 5.0000 mg | ORAL_TABLET | Freq: Every day | ORAL | 11 refills | Status: DC

## 2022-11-27 NOTE — Progress Notes (Signed)
Electrophysiology Office Follow up Visit Note:    Date:  11/27/2022   ID:  Angel Cherry, DOB 1950/08/30, MRN [id number]  PCP:  Nathaneil Canary, PA-C  [facility] Cardiologist:  None  [facility] Electrophysiologist:  Lanier Prude, MD    Interval History:    Angel Cherry is a 72 y.o. female who presents for a follow up visit.   Last seen 09/22/2022 for tachycardia. She had a monitor showing NSVT but a structurally normal heart on echo. She was taking nadolol. I ordered a cardiac MRI which was normal.   Today, she is concerned about her high blood pressures. She is on 1/2 tablet of Nadolol in the evenings, and 1 tablet of irbesartan in the mornings. However, her blood pressure is still elevated to the 150s-160/60s on average, and as high as the 170s systolic. She is following with our HTN clinic.   In the past she had issues with presyncopal episodes every now and then. This recurred a couple weeks ago. She states that everything goes black and she feels lightheaded for a few seconds. Normally she is always standing and she does feel the onset of the episode.  She denies any palpitations, chest pain, shortness of breath, or peripheral edema. No lightheadedness, headaches, orthopnea, or PND.     Past medical, surgical, social and family history were reviewed.  ROS:   Please see the history of present illness.    (+) Recent presyncope All other systems reviewed and are negative.  EKGs/Labs/Other Studies Reviewed:    The following studies were reviewed today:  11/04/2022 cMR 1. Normal LV and RV size and function.  LVEF = 69%.  2. There is no late gadolinium enhancement in the left ventricular myocardium.  3. Normal T1, T2, ECV values. No evidence for myocarditis or infiltrative disease.  4. No significant valvular abnormalities.     Physical Exam:    VS:  BP (!) 166/74   Pulse 60   Ht 5\' 2"  (1.575 m)   Wt 141 lb 3.2 oz (64 kg)   SpO2 99%   BMI 25.83 kg/m      Wt Readings from Last 3 Encounters:  11/27/22 141 lb 3.2 oz (64 kg)  09/22/22 136 lb 9.6 oz (62 kg)  04/28/22 136 lb (61.7 kg)     GEN: Well nourished, well developed in no acute distress CARDIAC: RRR, no murmurs, rubs, gallops RESPIRATORY:  Clear to auscultation without rales, wheezing or rhonchi       ASSESSMENT:    1. Palpitations   2. Primary hypertension    PLAN:    In order of problems listed above:   #NSVT Doing well on nadolol.  I would recommend continuing this for now.  #Hypertension Above goal today.  Recommend checking blood pressures 1-2 times per week at home and recording the values.  Recommend bringing these recordings to the primary care physician. Add amlodipine 5 mg by mouth once daily Follow-up with hypertension clinic in the next 1 to 2 weeks for further titration of her antihypertensive regimen.  #Orthostatic hypotension The patient describes several episodes of near syncope while standing where her vision goes dark.  I have encouraged her to sit down immediately should she experience these prodromal symptoms.  She should stay adequately hydrated.  I do not suspect this is related to her PVCs/NSVT.   Follow up w EP APP in 1 year.      I,Mathew Stumpf,acting as a Neurosurgeon for [employer]  Akari Crysler, MD.,have documented all relevant documentation on the behalf of Lanier Prude, MD,as directed by  Lanier Prude, MD while in the presence of Lanier Prude, MD.  I, Lanier Prude, MD, have reviewed all documentation for this visit. The documentation on 11/27/22 for the exam, diagnosis, procedures, and orders are all accurate and complete.   Signed, Steffanie Dunn, MD, [facility], [facility] 11/27/2022 10:08 AM    Electrophysiology [facility]

## 2022-11-27 NOTE — Patient Instructions (Addendum)
Medication Instructions:  Your physician has recommended you make the following change in your medication:  START Amlodipine 5 mg once daily  *If you need a refill on your cardiac medications before your next appointment, please call your pharmacy*   Lab Work: None ordered If you have labs (blood work) drawn today and your tests are completely normal, you will receive your results only by: MyChart Message (if you have MyChart) OR A paper copy in the mail If you have any lab test that is abnormal or we need to change your treatment, we will call you to review the results.   Testing/Procedures: None ordered   Follow-Up: At [facility], you and your health needs are our priority.  As part of our continuing mission to provide you with exceptional heart care, we have created designated Provider Care Teams.  These Care Teams include your primary Cardiologist (physician) and Advanced Practice Providers (APPs -  Physician Assistants and Nurse Practitioners) who all work together to provide you with the care you need, when you need it.  Your physician recommends that you schedule a follow-up appointment in: the Hypertension clinic in the next several weeks.  Your next appointment:   1 year(s)  The format for your next appointment:   In Person  Provider:   You will see one of the following Advanced Practice Providers on your designated Care Team:   Francis Dowse, [place] Casimiro Needle "Mardelle Matte" Lanna Poche, [place]  Thank you for choosing [facility]!!   [phone]  Other Instructions  Amlodipine Tablets What is this medication? AMLODIPINE (am LOE di peen) treats high blood pressure and prevents chest pain (angina). It works by relaxing the blood vessels, which helps decrease the amount of work your heart has to do. It belongs to a group of medications called calcium channel blockers. This medicine may be used for other purposes; ask your health care provider or pharmacist if you have  questions. COMMON BRAND NAME(S): Norvasc What should I tell my care team before I take this medication? They need to know if you have any of these conditions: Heart disease Liver disease An unusual or allergic reaction to amlodipine, other medications, foods, dyes, or preservatives Pregnant or trying to get pregnant Breastfeeding How should I use this medication? Take this medication by mouth. Take it as directed on the prescription label at the same time every day. You can take it with or without food. If it upsets your stomach, take it with food. Keep taking it unless your care team tells you to stop. Talk to your care team about the use of this medication in children. While it may be prescribed for children as young as 6 for selected conditions, precautions do apply. Overdosage: If you think you have taken too much of this medicine contact a poison control center or emergency room at once. NOTE: This medicine is only for you. Do not share this medicine with others. What if I miss a dose? If you miss a dose, take it as soon as you can. If it is almost time for your next dose, take only that dose. Do not take double or extra doses. What may interact with this medication? Clarithromycin Cyclosporine Diltiazem Itraconazole Simvastatin Tacrolimus This list may not describe all possible interactions. Give your health care provider a list of all the medicines, herbs, non-prescription drugs, or dietary supplements you use. Also tell them if you smoke, drink alcohol, or use illegal drugs. Some items may interact with your medicine. What should  I watch for while using this medication? Visit your care team for regular checks on your progress. Check your blood pressure as directed. Know what your blood pressure should be and when to contact your care team. Do not treat yourself for coughs, colds, or pain while you are using this medication without asking your care team for advice. Some medications  may increase your blood pressure. This medication may affect your coordination, reaction time, or judgment. Do not drive or operate machinery until you know how this medication affects you. Sit up or stand slowly to reduce the risk of dizzy or fainting spells. Drinking alcohol with this medication can increase the risk of these side effects. What side effects may I notice from receiving this medication? Side effects that you should report to your care team as soon as possible: Allergic reactions--skin rash, itching, hives, swelling of the face, lips, tongue, or throat Heart attack--pain or tightness in the chest, shoulders, arms, or jaw, nausea, shortness of breath, cold or clammy skin, feeling faint or lightheaded Low blood pressure--dizziness, feeling faint or lightheaded, blurry vision Worsening chest pain (angina)--pain, pressure, or tightness in the chest, neck, back, or arms Side effects that usually do not require medical attention (report these to your care team if they continue or are bothersome): Facial flushing, redness Heart palpitations--rapid, pounding, or irregular heartbeat Nausea Stomach pain Swelling of the ankles, hands, or feet This list may not describe all possible side effects. Call your doctor for medical advice about side effects. You may report side effects to FDA at 1-800-FDA-1088. Where should I keep my medication? Keep out of the reach of children and pets. Store at room temperature between 20 and 25 degrees C (68 and 77 degrees F). Protect from light and moisture. Keep the container tightly closed. Get rid of any unused medication after the expiration date. To get rid of medications that are no longer needed or have expired: Take the medication to a medication take-back program. Check with your pharmacy or law enforcement to find a location. If you cannot return the medication, check the label or package insert to see if the medication should be thrown out in the  garbage or flushed down the toilet. If you are not sure, ask your care team. If it is safe to put in the trash, empty the medication out of the container. Mix the medication with cat litter, dirt, coffee grounds, or other unwanted substance. Seal the mixture in a bag or container. Put it in the trash. NOTE: This sheet is a summary. It may not cover all possible information. If you have questions about this medicine, talk to your doctor, pharmacist, or health care provider.  2023 Elsevier/Gold Standard (2022-02-02 00:00:00)

## 2022-11-27 NOTE — Telephone Encounter (Signed)
Called pt to discuss. BP increases later in the day to the 160s, better controlled in the AM. Reports she sees Dr Lalla Brothers this AM for f/u and will discuss. Did relay message about holding her nadolol. May need dose increase of her irbesartan and could consider taking BID to see if it helps give her better 24 hr control.

## 2022-12-08 ENCOUNTER — Telehealth: Payer: Medicare HMO | Admitting: Nurse Practitioner

## 2022-12-08 DIAGNOSIS — N898 Other specified noninflammatory disorders of vagina: Secondary | ICD-10-CM

## 2022-12-08 MED ORDER — FLUCONAZOLE 150 MG PO TABS
150.0000 mg | ORAL_TABLET | Freq: Once | ORAL | 0 refills | Status: AC
Start: 2022-12-08 — End: 2022-12-08

## 2022-12-08 NOTE — Progress Notes (Signed)

## 2022-12-15 ENCOUNTER — Other Ambulatory Visit (HOSPITAL_COMMUNITY): Payer: Medicare HMO

## 2022-12-16 ENCOUNTER — Ambulatory Visit: Payer: Medicare HMO | Attending: Cardiovascular Disease | Admitting: Pharmacist

## 2022-12-16 VITALS — BP 120/61 | HR 71

## 2022-12-16 DIAGNOSIS — I10 Essential (primary) hypertension: Secondary | ICD-10-CM | POA: Diagnosis not present

## 2022-12-16 MED ORDER — NADOLOL 20 MG PO TABS: 10.0000 mg | ORAL_TABLET | Freq: Every day | ORAL | 3 refills | Status: AC

## 2022-12-16 NOTE — Progress Notes (Signed)
Patient ID: Angel Cherry                 DOB: [birth year]                      MRN: [id number]     HPI: Angel Cherry is a 72 y.o. female referred by Dr. Lalla Brothers to HTN clinic. PMH is significant for HTN and possible TIA. First saw Dr Lalla Brothers on 09/22/22. 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 Pt had initially noticed low HR in the 40s, felt fatigued and lightheaded, other times with HR into the 90s, they started her on nadolol. She also reported presyncopal spells occurring randomly - would feel lightheaded, like she was going to pass out and everything would go black. BP elevated at 174/82, HR 46 at visit with Dr Lalla Brothers on 09/22/22. Pt reported BP previously better controlled on irbesartan but that was stopped when she was started on nadolol. She was scheduled for cardiac MRI, advised to restart her irbesartan, and scheduled for PharmD f/u. Pt sent in message on 3/13 feeling lightheaded. Her BP was 140s/60s and her HR was in the upper 40s. She was advised by MD to stop her irbesartan and continue on nadolol.   I saw pt on 10/19/22 where she was hypertensive and bradycardic - BP 168/72, HR 46. I resumed her irbesartan at 75mg  daily and decreased her nadolol to 10mg  daily. HR remained low in the 40s and BP remained elevated. I increased her irbesartan back to 150mg  daily and messaged Dr Lalla Brothers about stopping her nadolol. Had advised she could trial off, however saw pt same day and HR was 60 - nadolol was continued for her NSVT. She was started on amlodipine 5mg  daily and presents today for follow up.  Has ongoing trouble with her arthritis. Has followed with [facility] until her provider retired. Previously took plaquenil which worked well, however her cardiologist and neurologist previously told her that it caused her TIA, although she reports her SBP was also ~200 at the time. Since then, had taken prednisone 10mg  daily  which helped, but caused weight gain so she stopped this about 3 months ago. Avoids NSAIDs and Tylenol is ineffective. Generally feels like a mack truck hit her when she wakes up in the morning from her fatigue and pain.   Pt presents today for follow up. Reports tolerating amlodipine well. Denies dizziness, headache, and LE edema. Baseline amount of fatigue. Has had 2 more episodes of feeling like she's going to pass out since her last visit with Dr Lalla Brothers. Has made sure to sit down when she feels them coming out. Hasn't passed out. Will be standing up during the episodes, no recent positional changes. Doesn't like drinking water so may not be as hydrated. BP has improved at home. Typically 110-120s/60s around lunch and 4pm. Occasional higher readings in the AM up to 140 systolic, however this is usually after a restless night of sleep. Majority of readings well controlled. HR improved to upper 50s.  Current HTN meds: amlodipine 5mg  daily - AM, irbesartan 150mg  daily - AM, nadolol 10mg  daily - lunch Previously tried: metoprolol - SOB BP goal: <130/8mmHg  Family History: Mother with CHF and breast cancer, brother with cancer.  Social History: No alcohol, drug, or tobacco use.  Diet: Minimal caffeine. Avoids salts and sweets.  Exercise: hasn't been able to walk as much lately  due to fatigue and pain  Wt Readings from Last 3 Encounters:  11/27/22 141 lb 3.2 oz (64 kg)  09/22/22 136 lb 9.6 oz (62 kg)  04/28/22 136 lb (61.7 kg)   BP Readings from Last 3 Encounters:  11/27/22 (!) 166/74  10/19/22 (!) 168/72  09/22/22 (!) 174/82   Pulse Readings from Last 3 Encounters:  11/27/22 60  10/19/22 (!) 46  09/22/22 (!) 46    Renal function: CrCl cannot be calculated (Patient's most recent lab result is older than the maximum 21 days allowed.).  Past Medical History:  Diagnosis Date   Abdominal pain    Abnormal ECG    Acute respiratory infection    Anemia    Arthropathy    Body aches     Bradycardia    Bronchitis    Bruit    Cough    Cushingoid facies    Dizziness    Fatigue    Hematuria    Hyperlipidemia    Hypertension    IGT (impaired glucose tolerance)    Mitral regurgitation    Neck muscle spasm    Osteoarthritis    Pancreatic cyst    Paronychia of finger    Pollen allergies    Pre-syncope    Right hand weakness    Serum total bilirubin elevated    Sinus congestion    Sinusitis    SOB (shortness of breath) on exertion    SSS (sick sinus syndrome) (HCC)    TIA (transient ischemic attack)    URI (upper respiratory infection)    UTI (urinary tract infection)    VT (ventricular tachycardia) (HCC)    Weight gain    Wheezing     Current Outpatient Medications on File Prior to Visit  Medication Sig Dispense Refill   amLODipine (NORVASC) 5 MG tablet Take 1 tablet (5 mg total) by mouth daily. 30 tablet 11   amoxicillin-clavulanate (AUGMENTIN) 875-125 MG tablet Take 1 tablet by mouth 2 (two) times daily. 14 tablet 0   Cholecalciferol 1.25 MG (50000 UT) capsule Take 1 capsule by mouth once a week.     clopidogrel (PLAVIX) 75 MG tablet Take 75 mg by mouth daily.     Coenzyme Q10 (COQ10) 200 MG CAPS Take 1 capsule by mouth daily. 1 capsule 1   irbesartan (AVAPRO) 150 MG tablet Take 150 mg by mouth daily.     nadolol (CORGARD) 20 MG tablet Take 10 mg by mouth daily.     oxybutynin (DITROPAN) 5 MG tablet Take 10 mg by mouth at bedtime.     pantoprazole (PROTONIX) 40 MG tablet Take 40 mg by mouth daily.     predniSONE (STERAPRED UNI-PAK 21 TAB) 10 MG (21) TBPK tablet 6 day taper; take as directed on package instructions 21 tablet 0   pregabalin (LYRICA) 25 MG capsule Take 25 mg by mouth daily.     rosuvastatin (CRESTOR) 40 MG tablet Take by mouth.     tiZANidine (ZANAFLEX) 4 MG tablet Take 1 tablet by mouth every 6 (six) hours.     traZODone (DESYREL) 150 MG tablet Take by mouth at bedtime.     No current facility-administered medications on file prior to  visit.    Allergies  Allergen Reactions   Denture Adhesive Other (See Comments)    Takes skin off Takes skin off   Metoprolol     Fatigue/weakness and low diastolic BP    Oxycodone Nausea And Vomiting   Terconazole Rash   Daypro [  Oxaprozin]    Venlafaxine Nausea Only     Assessment/Plan:  1. Hypertension - BP excellent, improved and now at goal < 130/109mmHg in clinic and with majority of home readings. HR has also improved to the 50s. Will continue current meds including irbesartan 150mg  daily, amlodipine 5mg  daily, and nadolol 10mg  daily. F/u with PharmD as needed.  Angel Cherry, PharmD, BCACP, CPP [facility] [address], [place], [place] [address] Phone: [phone]; Fax: [phone] 12/16/2022 11:39 AM

## 2022-12-16 NOTE — Patient Instructions (Addendum)
Your blood pressure goal is < 130/37mmHg   Your pressure is excellent! Continue taking your current medications  Important lifestyle changes to control high blood pressure  Intervention  Effect on the BP   Weight loss Weight loss is one of the most effective lifestyle changes for controlling blood pressure. If you're overweight or obese, losing even a small amount of weight can help reduce blood pressure.    Blood pressure can decrease by 1 millimeter of mercury (mmHg) with each kilogram (about 2.2 pounds) of weight lost.   Exercise regularly As a general goal, aim for 30 minutes of moderate physical activity every day.    Regular physical activity can lower blood pressure by 5 - 8 mmHg.   Eat a healthy diet Eat a diet rich in whole grains, fruits, vegetables, lean meat, and low-fat dairy products. Limit processed foods, saturated fat, and sweets.    A heart-healthy diet can lower high blood pressure by 10 mmHg.   Reduce salt (sodium) in your diet Aim for 000mg  of sodium each day. Avoid deli meats, canned food, and frozen microwave meals which are high in sodium.     Limiting sodium can reduce blood pressure by 5 mmHg.   Limit alcohol One drink equals 12 ounces of beer, 5 ounces of wine, or 1.5 ounces of 80-proof liquor.    Limiting alcohol to < 1 drink a day for women or < 2 drinks a day for men can help lower blood pressure by about 4 mmHg.   To check your pressure at home you will need to:   Sit up in a chair, with feet flat on the floor and back supported. Do not cross your ankles or legs. Rest your left arm so that the cuff is about heart level. If the cuff goes on your upper arm, then just relax your arm on the table, arm of the chair, or your lap. If you have a wrist cuff, hold your wrist against your chest at heart level. Place the cuff snugly around your arm, about 1 inch above the crease of your elbow. The cords should be inside the groove of your elbow.  Sit  quietly, with the cuff in place, for about 5 minutes. Then press the power button to start a reading. Do not talk or move while the reading is taking place.  Record your readings on a sheet of paper. Although most cuffs have a memory, it is often easier to see a pattern developing when the numbers are all in front of you.  You can repeat the reading after 1-3 minutes if it is recommended.   Make sure your bladder is empty and you have not had caffeine or tobacco within the last 30 minutes   Always bring your blood pressure log with you to your appointments. If you have not brought your monitor in to be double checked for accuracy, please bring it to your next appointment.   You can find a list of validated (accurate) blood pressure cuffs at: validatebp.org

## 2022-12-23 ENCOUNTER — Encounter: Payer: Self-pay | Admitting: Neurology

## 2022-12-24 ENCOUNTER — Encounter: Payer: Self-pay | Admitting: Pharmacist

## 2023-01-30 ENCOUNTER — Encounter: Payer: Self-pay | Admitting: Pharmacist

## 2023-01-30 ENCOUNTER — Other Ambulatory Visit: Payer: Self-pay | Admitting: Cardiology

## 2023-02-01 MED ORDER — AMLODIPINE BESYLATE 2.5 MG PO TABS: 2.5000 mg | ORAL_TABLET | Freq: Every day | ORAL | 5 refills | Status: DC

## 2023-02-09 ENCOUNTER — Encounter: Payer: Self-pay | Admitting: Neurology

## 2023-02-09 ENCOUNTER — Encounter: Payer: Self-pay | Admitting: Cardiology

## 2023-02-10 ENCOUNTER — Telehealth: Payer: Medicare HMO | Admitting: Nurse Practitioner

## 2023-02-10 DIAGNOSIS — B379 Candidiasis, unspecified: Secondary | ICD-10-CM

## 2023-02-10 DIAGNOSIS — R3989 Other symptoms and signs involving the genitourinary system: Secondary | ICD-10-CM | POA: Diagnosis not present

## 2023-02-10 DIAGNOSIS — T3695XA Adverse effect of unspecified systemic antibiotic, initial encounter: Secondary | ICD-10-CM | POA: Diagnosis not present

## 2023-02-10 MED ORDER — FLUCONAZOLE 150 MG PO TABS
150.0000 mg | ORAL_TABLET | Freq: Once | ORAL | 0 refills | Status: AC
Start: 2023-02-10 — End: 2023-02-10

## 2023-02-10 MED ORDER — CEPHALEXIN 500 MG PO CAPS
500.0000 mg | ORAL_CAPSULE | Freq: Two times a day (BID) | ORAL | 0 refills | Status: AC
Start: 2023-02-10 — End: 2023-02-17

## 2023-02-10 NOTE — Progress Notes (Signed)
E-Visit for Urinary Problems  We are sorry that you are not feeling well.  Here is how we plan to help!  Based on what you shared with me it looks like you most likely have a simple urinary tract infection.  A UTI (Urinary Tract Infection) is a bacterial infection of the bladder.  Most cases of urinary tract infections are simple to treat but a key part of your care is to encourage you to drink plenty of fluids and watch your symptoms carefully.  I have prescribed Keflex 500 mg twice a day for 7 days.  Your symptoms should gradually improve. Call us if the burning in your urine worsens, you develop worsening fever, back pain or pelvic pain or if your symptoms do not resolve after completing the antibiotic. We will also send in one Diflucan to help with any yeast symptoms.   If you develop worsening symptoms or fever you need to be seen in person for evaluation and urine testing.   Urinary tract infections can be prevented by drinking plenty of water to keep your body hydrated.  Also be sure when you wipe, wipe from front to back and don't hold it in!  If possible, empty your bladder every 4 hours.  HOME CARE Drink plenty of fluids Compete the full course of the antibiotics even if the symptoms resolve Remember, when you need to go.go. Holding in your urine can increase the likelihood of getting a UTI! GET HELP RIGHT AWAY IF: You cannot urinate You get a high fever Worsening back pain occurs You see blood in your urine You feel sick to your stomach or throw up You feel like you are going to pass out  MAKE SURE YOU  Understand these instructions. Will watch your condition. Will get help right away if you are not doing well or get worse.   Thank you for choosing an e-visit.  Your e-visit answers were reviewed by a board certified advanced clinical practitioner to complete your personal care plan. Depending upon the condition, your plan could have included both over the counter or  prescription medications.  Please review your pharmacy choice. Make sure the pharmacy is open so you can pick up prescription now. If there is a problem, you may contact your provider through [employer] and have the prescription routed to another pharmacy.  Your safety is important to [place]. If you have drug allergies check your prescription carefully.   For the next 24 hours you can use MyChart to ask questions about today's visit, request a non-urgent call back, or ask for a work or school excuse. You will get an email in the next two days asking about your experience. I hope that your e-visit has been valuable and will speed your recovery.   Meds ordered this encounter  Medications   cephALEXin (KEFLEX) 500 MG capsule    Sig: Take 1 capsule (500 mg total) by mouth 2 (two) times daily for 7 days.    Dispense:  14 capsule    Refill:  0   fluconazole (DIFLUCAN) 150 MG tablet    Sig: Take 1 tablet (150 mg total) by mouth once for 1 dose.    Dispense:  1 tablet    Refill:  0    I spent approximately 5 minutes reviewing the patient's history, current symptoms and coordinating their care today.

## 2023-02-17 ENCOUNTER — Telehealth: Payer: Self-pay

## 2023-02-17 ENCOUNTER — Telehealth: Payer: Self-pay | Admitting: Cardiology

## 2023-02-17 NOTE — Telephone Encounter (Signed)
   Pre-operative Risk Assessment    Patient Name: Angel Cherry  DOB: 1950/11/21 MRN: [id number]      Request for Surgical Clearance    Procedure:   Endoscopic Sinus Surgery   Date of Surgery:  Clearance 03/15/23                                 Surgeon:  Dr. [name]  Surgeon's Group or Practice Name:  [facility]  Phone number:  [phone] Fax number:  [phone]   Type of Clearance Requested:   - Medical  - Pharmacy:  Hold Clopidogrel (Plavix) However many days Dr.Lambert requests.   Type of Anesthesia:  General    Additional requests/questions:   When does the pt need to start back on Plavix after the surgery.   Signed, Windy Fast Bolick   02/17/2023, 10:33 AM

## 2023-02-17 NOTE — Telephone Encounter (Signed)
Pt is scheduled for tele on 8/5 at 1:40pm. Med rec and consent done

## 2023-02-17 NOTE — Telephone Encounter (Signed)
   Name: Angel Cherry  DOB: [birth year]  MRN: [id number]  Primary Cardiologist: None   Preoperative team, please contact this patient and set up a phone call appointment for further preoperative risk assessment. Please obtain consent and complete medication review. Thank you for your help.  I confirm that guidance regarding antiplatelet and oral anticoagulation therapy has been completed and, if necessary, noted below.  Request for holding Plavix will need to be addressed by Neurology.    Joni Reining, NP 02/17/2023, 11:23 AM [facility]

## 2023-02-17 NOTE — Telephone Encounter (Signed)
Pt is scheduled for tele on 8/5 at 1:40pm. Med rec and consent done    Patient Consent for Virtual Visit        Angel Cherry has provided verbal consent on 02/17/2023 for a virtual visit (video or telephone).   CONSENT FOR VIRTUAL VISIT FOR:  Angel Cherry  By participating in this virtual visit I agree to the following:  I hereby voluntarily request, consent and authorize [facility] and its employed or contracted physicians, physician assistants, nurse practitioners or other licensed health care professionals (the Practitioner), to provide me with telemedicine health care services (the "Services") as deemed necessary by the treating Practitioner. I acknowledge and consent to receive the Services by the Practitioner via telemedicine. I understand that the telemedicine visit will involve communicating with the Practitioner through live audiovisual communication technology and the disclosure of certain medical information by electronic transmission. I acknowledge that I have been given the opportunity to request an in-person assessment or other available alternative prior to the telemedicine visit and am voluntarily participating in the telemedicine visit.  I understand that I have the right to withhold or withdraw my consent to the use of telemedicine in the course of my care at any time, without affecting my right to future care or treatment, and that the Practitioner or I may terminate the telemedicine visit at any time. I understand that I have the right to inspect all information obtained and/or recorded in the course of the telemedicine visit and may receive copies of available information for a reasonable fee.  I understand that some of the potential risks of receiving the Services via telemedicine include:  Delay or interruption in medical evaluation due to technological equipment failure or disruption; Information transmitted may not be sufficient (e.g. poor resolution of  images) to allow for appropriate medical decision making by the Practitioner; and/or  In rare instances, security protocols could fail, causing a breach of personal health information.  Furthermore, I acknowledge that it is my responsibility to provide information about my medical history, conditions and care that is complete and accurate to the best of my ability. I acknowledge that Practitioner's advice, recommendations, and/or decision may be based on factors not within their control, such as incomplete or inaccurate data provided by me or distortions of diagnostic images or specimens that may result from electronic transmissions. I understand that the practice of medicine is not an exact science and that Practitioner makes no warranties or guarantees regarding treatment outcomes. I acknowledge that a copy of this consent can be made available to me via my patient portal [facility] MyChart), or I can request a printed copy by calling the office of [facility].    I understand that my insurance will be billed for this visit.   I have read or had this consent read to me. I understand the contents of this consent, which adequately explains the benefits and risks of the Services being provided via telemedicine.  I have been provided ample opportunity to ask questions regarding this consent and the Services and have had my questions answered to my satisfaction. I give my informed consent for the services to be provided through the use of telemedicine in my medical care

## 2023-03-01 ENCOUNTER — Ambulatory Visit: Payer: Medicare HMO | Attending: Cardiovascular Disease | Admitting: Student

## 2023-03-01 DIAGNOSIS — Z01810 Encounter for preprocedural cardiovascular examination: Secondary | ICD-10-CM

## 2023-03-01 NOTE — Progress Notes (Signed)
Virtual Visit via Telephone Note   Because of Angel Cherry's co-morbid illnesses, she is at least at moderate risk for complications without adequate follow up.  This format is felt to be most appropriate for this patient at this time.  The patient did not have access to video technology/had technical difficulties with video requiring transitioning to audio format only (telephone).  All issues noted in this document were discussed and addressed.  No physical exam could be performed with this format.  Please refer to the patient's chart for her consent to telehealth for [facility].  Evaluation Performed:  Preoperative cardiovascular risk assessment _____________   Date:  03/01/2023   Patient ID:  Angel Cherry, DOB [birth year], MRN [id number] Patient Location:  Home Provider location:   Office  Primary Care Provider:  Nathaneil Canary, PA-C Primary Cardiologist:  None  Chief Complaint / Patient Profile   72 y.o. y/o female with a h/o PVCs/NSVT, hypertension, TIA, near syncope/orthostatic hypotension, GERD, anemia who is pending endoscopic sinus surgery by Dr. Pollyann Kennedy and presents today for telephonic preoperative cardiovascular risk assessment.  History of Present Illness    Angel Cherry is a 72 y.o. female who presents via audio/video conferencing for a telehealth visit today.  Pt was last seen in cardiology clinic on 11/27/2022 by Dr. Lalla Brothers.  At that time Angel Cherry was stable from a cardiac standpoint.  The patient is now pending procedure as outlined above. Since her last visit, she is doing well. Patient denies shortness of breath or dyspnea on exertion. No chest pain, pressure, or tightness. Denies orthopnea or PND. She gets mild lower extremity edema in ankles that is normal in the AM and progresses throughout the day. No palpitations.  She is very active around her home doing yard work, walking a lot, and taking her dog for walks.   Past Medical History    Past  Medical History:  Diagnosis Date   Abdominal pain    Abnormal ECG    Acute respiratory infection    Anemia    Arthropathy    Body aches    Bradycardia    Bronchitis    Bruit    Cough    Cushingoid facies    Dizziness    Fatigue    Hematuria    Hyperlipidemia    Hypertension    IGT (impaired glucose tolerance)    Mitral regurgitation    Neck muscle spasm    Osteoarthritis    Pancreatic cyst    Paronychia of finger    Pollen allergies    Pre-syncope    Right hand weakness    Serum total bilirubin elevated    Sinus congestion    Sinusitis    SOB (shortness of breath) on exertion    SSS (sick sinus syndrome) (HCC)    TIA (transient ischemic attack)    URI (upper respiratory infection)    UTI (urinary tract infection)    VT (ventricular tachycardia) (HCC)    Weight gain    Wheezing    Past Surgical History:  Procedure Laterality Date   CARPAL TUNNEL REPAIR     CATARACT EXTRACTION  2022   CHOLECYSTECTOMY     GALLBLADDER SURGERY     HYSTERECTOMY ABDOMINAL WITH SALPINGECTOMY     TOTAL SHOULDER REPLACEMENT      Allergies  Allergies  Allergen Reactions   Denture Adhesive Other (See Comments)    Takes skin off Takes skin off   Metoprolol  Fatigue/weakness and low diastolic BP    Oxycodone Nausea And Vomiting   Terconazole Rash   Daypro [Oxaprozin]    Venlafaxine Nausea Only    Home Medications    Prior to Admission medications   Medication Sig Start Date End Date Taking? Authorizing Provider  amLODipine (NORVASC) 2.5 MG tablet Take 1 tablet (2.5 mg total) by mouth daily. 02/01/23   Lanier Prude, MD  Cholecalciferol 1.25 MG (50000 UT) capsule Take 1 capsule by mouth once a week. 03/18/21   [provider]  clopidogrel (PLAVIX) 75 MG tablet Take 75 mg by mouth daily.    [provider]  Coenzyme Q10 (COQ10) 200 MG CAPS Take 1 capsule by mouth daily. 08/04/21   Micki Riley, MD  irbesartan (AVAPRO) 150 MG tablet Take 150 mg by mouth  daily. 10/19/22   Supple, Megan E, RPH-CPP  nadolol (CORGARD) 20 MG tablet Take 0.5 tablets (10 mg total) by mouth daily. 12/16/22   Lanier Prude, MD  oxybutynin (DITROPAN) 5 MG tablet Take 10 mg by mouth at bedtime. 03/24/21   [provider]  pantoprazole (PROTONIX) 40 MG tablet Take 40 mg by mouth daily.    [provider]  pregabalin (LYRICA) 25 MG capsule Take 25 mg by mouth daily. 06/15/22   [provider]  rosuvastatin (CRESTOR) 40 MG tablet Take by mouth. 01/22/22   [provider]  tiZANidine (ZANAFLEX) 4 MG tablet Take 1 tablet by mouth every 6 (six) hours. 04/29/20   [provider]  traZODone (DESYREL) 150 MG tablet Take by mouth at bedtime.    [provider]    Physical Exam    Vital Signs:  Angel Cherry does not have vital signs available for review today.  Given telephonic nature of communication, physical exam is limited. AAOx3. NAD. Normal affect.  Speech and respirations are unlabored.  Accessory Clinical Findings    None  Assessment & Plan    Primary Cardiologist: None  Preoperative cardiovascular risk assessment.  Endoscopic sinus surgery by Dr. Pollyann Kennedy on 03/15/2023.  Chart reviewed as part of pre-operative protocol coverage. According to the RCRI, patient has a 0.9% risk of MACE. Patient reports activity equivalent to >4.0 METS (walking, yard work, walking dog).   Given past medical history and time since last visit, based on ACC/AHA guidelines, Angel Cherry would be at acceptable risk for the planned procedure without further cardiovascular testing.   Patient was advised that if she develops new symptoms prior to surgery to contact our office to arrange a follow-up appointment.  she verbalized understanding.  Plavix prescribed by a noncardiology provider therefore recommendations for holding deferred to prescribing provider (neurology).    I will route this recommendation to the requesting party via  Epic fax function.  Please call with questions.  Time:   Today, I have spent 5 minutes with the patient with telehealth technology discussing medical history, symptoms, and management plan.     Carlos Levering, NP  03/01/2023, 8:31 AM

## 2023-03-01 NOTE — H&P (Signed)
HPI:   Chief Complaint  Patient presents with  Recurrent Sinusitis  Patient states she has recurrent sinusitis x 3 months   Angel Cherry is a 72 y.o. female who presents as a new patient for sinus concerns. She reports a history of significant allergies most of her life. History of allergy shots in her 20's through [facility] with good results. She reports occasional sinus infections over the past few years; symptoms are typically bilateral. Beginning three months ago in March, she developed left sided maxillary pain described as a throb (as though needing a root canal). She has dental implants along the maxilla and has seen her dentist; was told it was of sinus origin. Medical therapy has included two courses of Augmentin, oral prednisone, Decadron injection and Azelastine nasal spray. Symptoms improve temporarily but have not completely resolved. She is breathing well through the nose. Notices green mucous from her left side most mornings. Sense of smell is diminished but this is not new. No fever, acute vision changes, nasal blockage, postnasal drip, chronic cough, hoarseness, sore throat or dysphagia.   History of TIA; currently taking Plavix.   No PMH of diabetes, asthma, bleeding disorder, obstructive sleep apnea or adverse reaction to anesthesia.   Prior imaging includes MRI brain from 05/14/21 demonstrating chronic inflammatory changes of the left maxillary sinus.   PMH/Meds/All/SocHx/FamHx/ROS:   Past Medical History:  Diagnosis Date  Anemia  Arthritis  Complex cyst of right ovary 11/05/2017  Diverticulosis 07/16/2016  Fibromyalgia 04/18/2011  Gastritis 2019  History of transfusion  Hypertension 09/18/2020  increased Losartan from 50 mg to 75 mg  Osteoarthrosis  PONV (postoperative nausea and vomiting)  Prediabetes 01/21/2016  Stroke (cerebrum) (CMS/HCC)  TIA (2016)  TIA (transient ischemic attack) 09/2014  Vitamin D deficiency   Past Surgical History:  Procedure  Laterality Date  APPENDECTOMY  Procedure: APPENDECTOMY  BREAST BIOPSY Right 2000  Procedure: BREAST BIOPSY; bg  BREAST BIOPSY Right  Procedure: BREAST BIOPSY; bg  CARPAL TUNNEL RELEASE  Procedure: CARPAL TUNNEL RELEASE  CATARACT EXTRACTION W/ INTRAOCULAR LENS IMPLANT Right 09/11/2020  Procedure: CATARACT EXTRACTION RIGHT EYE /W IMPLANT; Surgeon: Berkley Harvey, MD; Location: [facility]; Service: Ophthalmology; Laterality: Right; 30TIP; PF LIDOCAINE; BSS/EPI (8:2)  CATARACT EXTRACTION W/ INTRAOCULAR LENS IMPLANT Left 10/09/2020  Procedure: CATARACT EXTRACTION LEFT EYE /W IMPLANT; Surgeon: Berkley Harvey, MD; Location: [facility] OR; Service: Ophthalmology; Laterality: Left; 30TIP; PF LIDOCAINE; BSS/EPI (8:2)  CATARACT EXTRACTION W/ INTRAOCULAR LENS IMPLANT Left 10/09/2020  Procedure: CATARACT EXTRACTION W/ INTRAOCULAR LENS IMPLANT; JZF [id number] 16.0D SN60WF  CATARACT EXTRACTION W/ INTRAOCULAR LENS IMPLANT Right 09/11/2020  Procedure: CATARACT EXTRACTION W/ INTRAOCULAR LENS IMPLANT; JZF SN60WF  DILATION AND CURETTAGE OF UTERUS  Procedure: DILATION AND CURETTAGE OF UTERUS  GALLBLADDER SURGERY >85yrs  Procedure: GALLBLADDER SURGERY  HYSTERECTOMY  Procedure: HYSTERECTOMY; at age 53  HYSTERECTOMY  Procedure: HYSTERECTOMY  LAPAROSCOPIC SALPINGOOPHERECTOMY Bilateral 12/08/2017  Procedure: SALPINGO-OOPHORECTOMY LAPAROSCOPIC; Surgeon: Tana Felts, MD; Location: [facility] OR; Service: Gynecology; Laterality: Bilateral; NEED ENDOCATCH BAG  OTHER SURGICAL HISTORY  Procedure: OTHER SURGICAL HISTORY (cyst removed from wrist)  OTHER SURGICAL HISTORY 06/05/2016  Procedure: ANTERIOR COLPORRHAPHY, REPAIR OF CYSTOCELE WITH OR WITHOUT REPAIR OF URETHROCELE; Surgeon: Carnella Guadalajara, MD; Location: [facility]; Service: Urology  OTHER SURGICAL HISTORY 06/05/2016  Procedure: SLING OPERATION FOR STRESS INCONTINENCE (EG, FASCIA OR SYNTHETIC); Surgeon: Carnella Guadalajara, MD; Location: [facility]; Service: Urology  ROTATOR CUFF REPAIR Right  Procedure: ROTATOR CUFF REPAIR; x4  TONSILLECTOMY  Procedure: TONSILLECTOMY  TOTAL SHOULDER ARTHROPLASTY Left  Procedure: TOTAL SHOULDER REPLACEMENT  WISDOM TOOTH EXTRACTION  Procedure: WISDOM TOOTH EXTRACTION   No family history of bleeding disorders, wound healing problems or difficulty with anesthesia.     Current Outpatient Medications:  cholecalciferol (VITAMIN D3) 1,250 mcg (50,000 unit) capsule, Take 1 capsule by mouth once a week., Disp: , Rfl:  clopidogreL (PLAVIX) 75 mg tablet, Take 75 mg by mouth Once Daily., Disp: 90 tablet, Rfl: 3 clotrimazole-betamethasone (LOTRISONE) 1-0.05 % cream, APPLY A THIN LAYER OF CREAM TO VULVA TWICE DAILY AS NEEDED FOR IRRITATION, Disp: 30 g, Rfl: 1 coenzyme Q-10 200 mg capsule, Take 1 capsule by mouth daily., Disp: , Rfl:  famotidine (PEPCID) 40 mg tablet, Take 1 tablet (40 mg total) by mouth daily., Disp: 90 tablet, Rfl: 3 irbesartan (AVAPRO) 150 mg tablet, Take 150 mg by mouth., Disp: , Rfl:  magnesium 200 mg tab, Take by mouth daily., Disp: , Rfl:  nadoloL (CORGARD) 20 mg tablet, Take 20 mg by mouth daily., Disp: , Rfl:  oxybutynin (DITROPAN) 5 mg tablet, , Disp: , Rfl:  pantoprazole (PROTONIX) 40 mg EC tablet, Take 1 tablet (40 mg total) by mouth in the morning and 1 tablet (40 mg total) in the evening. Take before meals., Disp: 180 tablet, Rfl: 3 rosuvastatin (CRESTOR) 40 mg tablet, Take 40 mg by mouth., Disp: , Rfl:  tiZANidine (ZANAFLEX) 4 mg tablet, Take 4 mg by mouth every 6 (six) hours as needed., Disp: , Rfl:  traZODone (DESYREL) 50 mg tablet, Take 50 mg by mouth nightly as needed for sleep., Disp: , Rfl:  amLODIPine (NORVASC) 5 mg tablet, Take 5 mg by mouth daily., Disp: , Rfl:  atorvastatin (LIPITOR) 20 mg tablet, , Disp: , Rfl:  azithromycin (ZITHROMAX) 250 mg tablet, , Disp: , Rfl:  bupivacaine HCl (MARCAINE) 0.25 % (2.5 mg/mL) injection, 25 mg by  epidural route., Disp: , Rfl:  cephALEXin (KEFLEX) 500 mg capsule, , Disp: , Rfl:  cholecalciferol (VITAMIN D3) 1,000 unit (25 mcg) tablet, Take 1,000 Units by mouth daily., Disp: , Rfl:  cloNIDine (CATAPRES) 0.1 mg tablet, , Disp: , Rfl:  cyclobenzaprine (FLEXERIL) 5 mg tablet, , Disp: , Rfl:  ergocalciferol (VITAMIN D2) 1,250 mcg (50,000 unit) capsule, Take 50,000 Units by mouth once a week., Disp: , Rfl:  fluocinonide 0.05 % gel, , Disp: , Rfl:  fluticasone propionate (FLONASE) 50 mcg/spray nasal spray, 1 spray., Disp: , Rfl:  gabapentin (NEURONTIN) 300 mg capsule, Take 300 mg by mouth., Disp: , Rfl:  hydroCHLOROthiazide (HYDRODIURIL) 12.5 mg capsule, , Disp: , Rfl:  HYDROcodone-acetaminophen (NORCO) 10-325 mg per tablet, , Disp: , Rfl:  HYDROcodone-acetaminophen (NORCO) 5-325 mg per tablet, , Disp: , Rfl:  hydroxychloroquine (PLAQUENIL) 200 mg tablet, , Disp: , Rfl:  lisinopriL (PRINIVIL) 20 mg tablet, , Disp: , Rfl:  meloxicam (Mobic) 15 mg tablet, Take 1 tablet by mouth every morning., Disp: , Rfl:  methylPREDNISolone (MEDROL DOSEPAK) 4 mg 6 day dose pack, , Disp: , Rfl:  methylPREDNISolone (MEDROL) 8 mg tablet, , Disp: , Rfl:  metoprolol tartrate (LOPRESSOR) 25 mg tablet, , Disp: , Rfl:  montelukast (SINGULAIR) 10 mg tablet, Take 10 mg by mouth nightly., Disp: , Rfl:  NADOLOL-BENDROFLUMETHIAZIDE ORAL, , Disp: , Rfl:  PARoxetine (PAXIL) 10 mg tablet, , Disp: , Rfl:  potassium chloride (KLOR-CON) 20 mEq ER tablet, , Disp: , Rfl:  pregabalin (LYRICA) 25 mg capsule, , Disp: , Rfl:  traMADoL (ULTRAM) 50 mg tablet, , Disp: , Rfl:  valACYclovir (VALTREX) 1 gram tablet, daily as needed., Disp: , Rfl:  venlafaxine (EFFEXOR XR) 37.5 mg 24 hr capsule, , Disp: , Rfl:   A complete ROS was performed with pertinent positives/negatives noted in the HPI. The remainder of the ROS are negative.   Physical Exam:   Temp 97.1 F (36.2 C) (Temporal)  Resp 16  Ht 1.575 m (5\' 2" )  Wt 64.6 kg (142  lb 6.4 oz)  BMI 26.05 kg/m   General Awake, at baseline alertness during examination.  Eyes No scleral icterus or conjunctival hemorrhage. Globe position appears normal. EOMI.  Right Ear EAC patent, TM intact w/o inflammation. Middle ear well aerated.  Left Ear EAC patent, TM intact w/o inflammation. Middle ear well aerated.  Nose Mucopurulent discharge noted on the left inferior and middle turbinate. Patent, no polyps or masses seen on anterior rhinoscopy.  Oral cavity No mucosal lesions or tumors seen. Tongue midline.  Oropharynx Tonsils absent.  Neck No abnormal cervical lymphadenopathy. No thyromegaly. No thyroid masses palpated.  Cardio-vascular No cyanosis.  Pulmonary No audible stridor. Breathing easily with no labor.  Neuro Symmetric facial movement.  Psychiatry Appropriate affect and mood for clinic visit.   Independent Review of Additional Tests or Records:  Medical records, Radiology.   Procedures:  None  Impression & Plans:  Angel Cherry is a 72 y.o. female with history and exam most consistent with chronic maxillary sinusitis, left. Recommend a 14-day course of Clindamycin, 300mg  TID with meals and prednisone, 60mg  tapered over 9 days along with saline spray several times during the day. Recommend taking a probiotic supplement daily while on the antibiotic to prevent GI upset. Discussed potential adverse side effects of prednisone. Recommend follow up in about 2.5-3 weeks for maxillofacial CT imaging.   Patient agrees with the plan.

## 2023-03-11 ENCOUNTER — Encounter (HOSPITAL_COMMUNITY): Payer: Self-pay | Admitting: Otolaryngology

## 2023-03-11 ENCOUNTER — Other Ambulatory Visit: Payer: Self-pay

## 2023-03-11 NOTE — Progress Notes (Signed)
PCP - Romie Jumper Cardiologist - Steffanie Dunn  PPM/ICD - denies Device Orders - n/a Rep Notified - n/a  Chest x-ray -  EKG - 05-19-21 Stress Test -  ECHO -  Cardiac Cath -   CPAP - Denies  DM Denies  Blood Thinner Instructions: Plavix March 06, 2023 Aspirin Instructions: N/A  ERAS Protcol - NPO  COVID TEST- n/a  Anesthesia review: yes cardiac history  Patient verbally denies any shortness of breath, fever, cough and chest pain during phone call   -------------  SDW INSTRUCTIONS given:  Your procedure is scheduled on August 19,2024.  Report to [facility] Main Entrance "A" at 1000 A.M., and check in at the Admitting office.  Call this number if you have problems the morning of surgery:  [phone]   Remember:  Do not eat or drink after midnight the night before your surgery      Take these medicines the morning of surgery with A SIP OF WATER  amLODipine (NORVASC)  famotidine (PEPCID)  pantoprazole (PROTONIX)  rosuvastatin (CRESTOR)  If NEEDED: albuterol (VENTOLIN HFA)    As of today, STOP taking any Aspirin (unless otherwise instructed by your surgeon) Aleve, Naproxen, Ibuprofen, Motrin, Advil, Goody's, BC's, all herbal medications, fish oil, and all vitamins.                      Do not wear jewelry, make up, or nail polish            Do not wear lotions, powders, perfumes/colognes, or deodorant.            Do not shave 48 hours prior to surgery.  Men may shave face and neck.            Do not bring valuables to the hospital.            [facility] is not responsible for any belongings or valuables.  Do NOT Smoke (Tobacco/Vaping) 24 hours prior to your procedure If you use a CPAP at night, you may bring all equipment for your overnight stay.   Contacts, glasses, dentures or bridgework may not be worn into surgery.      For patients admitted to the hospital, discharge time will be determined by your treatment team.   Patients discharged the day  of surgery will not be allowed to drive home, and someone needs to stay with them for 24 hours.    Special instructions:   [place]- Preparing For Surgery  Before surgery, you can play an important role. Because skin is not sterile, your skin needs to be as free of germs as possible. You can reduce the number of germs on your skin by washing with CHG (chlorahexidine gluconate) Soap before surgery.  CHG is an antiseptic cleaner which kills germs and bonds with the skin to continue killing germs even after washing.    Oral Hygiene is also important to reduce your risk of infection.  Remember - BRUSH YOUR TEETH THE MORNING OF SURGERY WITH YOUR REGULAR TOOTHPASTE  Please do not use if you have an allergy to CHG or antibacterial soaps. If your skin becomes reddened/irritated stop using the CHG.  Do not shave (including legs and underarms) for at least 48 hours prior to first CHG shower. It is OK to shave your face.  Please follow these instructions carefully.   Shower the NIGHT BEFORE SURGERY and the MORNING OF SURGERY with DIAL Soap.   Pat yourself dry with a CLEAN  TOWEL.  Wear CLEAN PAJAMAS to bed the night before surgery  Place CLEAN SHEETS on your bed the night of your first shower and DO NOT SLEEP WITH PETS.   Day of Surgery: Please shower morning of surgery  Wear Clean/Comfortable clothing the morning of surgery Do not apply any deodorants/lotions.   Remember to brush your teeth WITH YOUR REGULAR TOOTHPASTE.   Questions were answered. Patient verbalized understanding of instructions.

## 2023-03-12 NOTE — Anesthesia Preprocedure Evaluation (Signed)
Anesthesia Evaluation  Patient identified by MRN, date of birth, ID band Patient awake    Reviewed: Allergy & Precautions, NPO status , Patient's Chart, lab work & pertinent test results  History of Anesthesia Complications (+) PONV and history of anesthetic complications  Airway Mallampati: II  TM Distance: >3 FB Neck ROM: Full    Dental no notable dental hx.    Pulmonary    Pulmonary exam normal        Cardiovascular hypertension, Pt. on medications and Pt. on home beta blockers + Peripheral Vascular Disease  + dysrhythmias (SSS) Supra Ventricular Tachycardia  Rhythm:Regular Rate:Normal     Neuro/Psych TIA negative psych ROS   GI/Hepatic Neg liver ROS,GERD  Medicated,,  Endo/Other  negative endocrine ROS    Renal/GU negative Renal ROS  negative genitourinary   Musculoskeletal  (+) Arthritis ,  Fibromyalgia -  Abdominal Normal abdominal exam  (+)   Peds  Hematology  (+) Blood dyscrasia, anemia Lab Results      Component                Value               Date                      WBC                      5.0                 10/05/2022                HGB                      11.9                10/05/2022                HCT                      35.3                10/05/2022                MCV                      92                  10/05/2022                PLT                      208                 10/05/2022             Lab Results      Component                Value               Date                      NA                       136                 05/14/2021  K                        3.8                 05/14/2021                CO2                      26                  05/14/2021                GLUCOSE                  100 (H)             05/14/2021                BUN                      17                  05/14/2021                CREATININE               0.90                 05/14/2021                CALCIUM                  9.0                 05/14/2021                GFRNONAA                 >60                 05/14/2021              Anesthesia Other Findings   Reproductive/Obstetrics                             Anesthesia Physical Anesthesia Plan  ASA: 3  Anesthesia Plan: General   Post-op Pain Management: Celebrex PO (pre-op)* and Tylenol PO (pre-op)*   Induction: Intravenous  PONV Risk Score and Plan: 4 or greater and Ondansetron, Dexamethasone, Midazolam and Treatment may vary due to age or medical condition  Airway Management Planned: Mask and Oral ETT  Additional Equipment: None  Intra-op Plan:   Post-operative Plan:   Informed Consent: I have reviewed the patients History and Physical, chart, labs and discussed the procedure including the risks, benefits and alternatives for the proposed anesthesia with the patient or authorized representative who has indicated his/her understanding and acceptance.     Dental advisory given  Plan Discussed with: CRNA  Anesthesia Plan Comments: (PAT note by Antionette Poles, PA-C: Follows with cardiology for history of  PVCs/NSVT, hypertension, TIA, near syncope/orthostatic hypotension.  Seen by Carlos Levering, NP on 03/01/2023 for preop evaluation.  Per note, "Preoperative cardiovascular risk assessment.  Endoscopic sinus surgery by Dr. Pollyann Kennedy on 03/15/2023. Chart reviewed as part of pre-operative protocol coverage. According to the RCRI, patient has a 0.9% risk of MACE. Patient reports activity equivalent to >4.0 METS (walking, yard work, walking  dog). Given past medical history and time since last visit, based on ACC/AHA guidelines, KEARSTON KOHN would be at acceptable risk for the planned procedure without further cardiovascular testing. Patient was advised that if she develops new symptoms prior to surgery to contact our office to arrange a follow-up appointment.  she verbalized  understanding."  Patient reports last dose Plavix 03/06/2023.  Patient will need day of surgery labs and evaluation.  EKG 05/14/2021: Sinus bradycardia.  Rate 57.  Probable anteroseptal infarct, old.  Cardiac MRI 11/04/2022: FINDINGS: 1. Normal left ventricular size, thickness and systolic function (LVEF = 69%). There are no regional wall motion abnormalities.  There is no late gadolinium enhancement in the left ventricular myocardium.  LVEDV: 88 ml  LVESV: 27 ml  SV: 60 ml  CO: 3.9 L/min  Myocardial mass: 91g  LV native T1 is 1033 ms (normal 1000 ms )  LV ECV value 28% (normal <30%)  LV T2 value = 51.9 (normal range 40-64 ms)  2. Normal right ventricular size, thickness and systolic function (RVEF = 53%). There are no regional wall motion abnormalities.  3.  Normal left and right atrial size.  4. Normal size of the aortic root, ascending aorta and pulmonary artery. No evidence for shunting  5.  Trace Tricuspid regurgitation.  6.  Normal pericardium.  No pericardial effusion.  IMPRESSION: 1. Normal LV and RV size and function.  LVEF = 69%.  2. There is no late gadolinium enhancement in the left ventricular myocardium.  3. Normal T1, T2, ECV values. No evidence for myocarditis or infiltrative disease.  4. No significant valvular abnormalities.  CT cardiac scoring 10/31/2021: IMPRESSION: Coronary calcium score of 0.  )        Anesthesia Quick Evaluation

## 2023-03-12 NOTE — Progress Notes (Signed)
Anesthesia Chart Review: Same-day workup  Follows with cardiology for history of  PVCs/NSVT, hypertension, TIA, near syncope/orthostatic hypotension.  Seen by Carlos Levering, NP on 03/01/2023 for preop evaluation.  Per note, "Preoperative cardiovascular risk assessment.  Endoscopic sinus surgery by Dr. Pollyann Kennedy on 03/15/2023. Chart reviewed as part of pre-operative protocol coverage. According to the RCRI, patient has a 0.9% risk of MACE. Patient reports activity equivalent to >4.0 METS (walking, yard work, walking dog). Given past medical history and time since last visit, based on ACC/AHA guidelines, Angel Cherry would be at acceptable risk for the planned procedure without further cardiovascular testing. Patient was advised that if she develops new symptoms prior to surgery to contact our office to arrange a follow-up appointment.  she verbalized understanding."  Patient reports last dose Plavix 03/06/2023.  Patient will need day of surgery labs and evaluation.  EKG 05/14/2021: Sinus bradycardia.  Rate 57.  Probable anteroseptal infarct, old.  Cardiac MRI 11/04/2022: FINDINGS: 1. Normal left ventricular size, thickness and systolic function (LVEF = 69%). There are no regional wall motion abnormalities.   There is no late gadolinium enhancement in the left ventricular myocardium.   LVEDV: 88 ml   LVESV: 27 ml   SV: 60 ml   CO: 3.9 L/min   Myocardial mass: 91g   LV native T1 is 1033 ms (normal 1000 ms )   LV ECV value 28% (normal <30%)   LV T2 value = 51.9 (normal range 40-64 ms)   2. Normal right ventricular size, thickness and systolic function (RVEF = 53%). There are no regional wall motion abnormalities.   3.  Normal left and right atrial size.   4. Normal size of the aortic root, ascending aorta and pulmonary artery. No evidence for shunting   5.  Trace Tricuspid regurgitation.   6.  Normal pericardium.  No pericardial effusion.   IMPRESSION: 1. Normal LV and RV  size and function.  LVEF = 69%.   2. There is no late gadolinium enhancement in the left ventricular myocardium.   3. Normal T1, T2, ECV values. No evidence for myocarditis or infiltrative disease.   4. No significant valvular abnormalities.  CT cardiac scoring 10/31/2021: IMPRESSION: Coronary calcium score of 0.    Antionette Poles, PA-C [facility] Short Stay Center/Anesthesiology Phone [phone] 03/12/2023 9:44 AM

## 2023-03-15 ENCOUNTER — Ambulatory Visit (HOSPITAL_COMMUNITY)
Admission: RE | Admit: 2023-03-15 | Discharge: 2023-03-15 | Disposition: A | Discharge status: Home / Self Care | Payer: Medicare HMO | Attending: Otolaryngology | Admitting: Otolaryngology

## 2023-03-15 ENCOUNTER — Ambulatory Visit (HOSPITAL_BASED_OUTPATIENT_CLINIC_OR_DEPARTMENT_OTHER): Payer: Medicare HMO | Admitting: Physician Assistant

## 2023-03-15 ENCOUNTER — Encounter (HOSPITAL_COMMUNITY): Payer: Self-pay | Admitting: Otolaryngology

## 2023-03-15 ENCOUNTER — Ambulatory Visit (HOSPITAL_COMMUNITY): Payer: Medicare HMO | Admitting: Physician Assistant

## 2023-03-15 ENCOUNTER — Encounter (HOSPITAL_COMMUNITY)
Admission: RE | Disposition: A | Discharge status: Home / Self Care | Payer: Self-pay | Source: Home / Self Care | Attending: Otolaryngology

## 2023-03-15 ENCOUNTER — Other Ambulatory Visit: Payer: Self-pay

## 2023-03-15 DIAGNOSIS — I739 Peripheral vascular disease, unspecified: Secondary | ICD-10-CM | POA: Diagnosis not present

## 2023-03-15 DIAGNOSIS — M797 Fibromyalgia: Secondary | ICD-10-CM | POA: Insufficient documentation

## 2023-03-15 DIAGNOSIS — K219 Gastro-esophageal reflux disease without esophagitis: Secondary | ICD-10-CM | POA: Insufficient documentation

## 2023-03-15 DIAGNOSIS — I10 Essential (primary) hypertension: Secondary | ICD-10-CM | POA: Insufficient documentation

## 2023-03-15 DIAGNOSIS — J328 Other chronic sinusitis: Secondary | ICD-10-CM

## 2023-03-15 DIAGNOSIS — Z7902 Long term (current) use of antithrombotics/antiplatelets: Secondary | ICD-10-CM | POA: Diagnosis not present

## 2023-03-15 DIAGNOSIS — J322 Chronic ethmoidal sinusitis: Secondary | ICD-10-CM | POA: Diagnosis present

## 2023-03-15 DIAGNOSIS — E785 Hyperlipidemia, unspecified: Secondary | ICD-10-CM

## 2023-03-15 DIAGNOSIS — Z8673 Personal history of transient ischemic attack (TIA), and cerebral infarction without residual deficits: Secondary | ICD-10-CM | POA: Diagnosis not present

## 2023-03-15 DIAGNOSIS — Z79899 Other long term (current) drug therapy: Secondary | ICD-10-CM | POA: Diagnosis not present

## 2023-03-15 DIAGNOSIS — J320 Chronic maxillary sinusitis: Secondary | ICD-10-CM | POA: Insufficient documentation

## 2023-03-15 HISTORY — PX: NASAL SINUS SURGERY: SHX719

## 2023-03-15 HISTORY — DX: Other specified postprocedural states: Z98.890

## 2023-03-15 HISTORY — DX: Fibromyalgia: M79.7

## 2023-03-15 LAB — CBC
HCT: 34.3 % — ABNORMAL LOW (ref 36.0–46.0)
Hemoglobin: 11.5 g/dL — ABNORMAL LOW (ref 12.0–15.0)
MCH: 31.2 pg (ref 26.0–34.0)
MCHC: 33.5 g/dL (ref 30.0–36.0)
MCV: 93 fL (ref 80.0–100.0)
Platelets: 237 10*3/uL (ref 150–400)
RBC: 3.69 MIL/uL — ABNORMAL LOW (ref 3.87–5.11)
RDW: 13 % (ref 11.5–15.5)
WBC: 5.3 10*3/uL (ref 4.0–10.5)
nRBC: 0 % (ref 0.0–0.2)

## 2023-03-15 LAB — BASIC METABOLIC PANEL
Anion gap: 10 (ref 5–15)
BUN: 15 mg/dL (ref 8–23)
CO2: 21 mmol/L — ABNORMAL LOW (ref 22–32)
Calcium: 8.9 mg/dL (ref 8.9–10.3)
Chloride: 107 mmol/L (ref 98–111)
Creatinine, Ser: 0.98 mg/dL (ref 0.44–1.00)
GFR, Estimated: >60 mL/min (ref >60)
Glucose, Bld: 86 mg/dL (ref 70–99)
Potassium: 3.6 mmol/L (ref 3.5–5.1)
Sodium: 138 mmol/L (ref 135–145)

## 2023-03-15 SURGERY — SINUS SURGERY, ENDOSCOPIC
Anesthesia: General | Site: Nose | Laterality: Left

## 2023-03-15 MED ORDER — OXYMETAZOLINE HCL 0.05 % NA SOLN
NASAL | Status: AC
  Filled 2023-03-15: qty 30

## 2023-03-15 MED ORDER — ROCURONIUM BROMIDE 10 MG/ML (PF) SYRINGE
PREFILLED_SYRINGE | INTRAVENOUS | Status: DC | PRN
  Administered 2023-03-15: 60 mg via INTRAVENOUS

## 2023-03-15 MED ORDER — OXYMETAZOLINE HCL 0.05 % NA SOLN
NASAL | Status: DC | PRN
  Administered 2023-03-15: 1 via TOPICAL

## 2023-03-15 MED ORDER — CELECOXIB 200 MG PO CAPS
200.0000 mg | ORAL_CAPSULE | Freq: Once | ORAL | Status: AC
  Administered 2023-03-15: 200 mg via ORAL
  Filled 2023-03-15: qty 1

## 2023-03-15 MED ORDER — FENTANYL CITRATE (PF) 100 MCG/2ML IJ SOLN
25.0000 ug | INTRAMUSCULAR | Status: DC | PRN
  Administered 2023-03-15: 25 ug via INTRAVENOUS
  Administered 2023-03-15: 50 ug via INTRAVENOUS

## 2023-03-15 MED ORDER — OXYMETAZOLINE HCL 0.05 % NA SOLN
2.0000 | NASAL | Status: DC
  Administered 2023-03-15: 2 via NASAL
  Filled 2023-03-15: qty 30

## 2023-03-15 MED ORDER — ACETAMINOPHEN 500 MG PO TABS
1000.0000 mg | ORAL_TABLET | Freq: Once | ORAL | Status: AC
  Administered 2023-03-15: 1000 mg via ORAL
  Filled 2023-03-15: qty 2

## 2023-03-15 MED ORDER — PHENYLEPHRINE HCL-NACL 20-0.9 MG/250ML-% IV SOLN
INTRAVENOUS | Status: DC | PRN
  Administered 2023-03-15: 25 ug/min via INTRAVENOUS

## 2023-03-15 MED ORDER — FENTANYL CITRATE (PF) 250 MCG/5ML IJ SOLN
INTRAMUSCULAR | Status: AC
  Filled 2023-03-15: qty 5

## 2023-03-15 MED ORDER — FENTANYL CITRATE (PF) 250 MCG/5ML IJ SOLN
INTRAMUSCULAR | Status: DC | PRN
  Administered 2023-03-15: 50 ug via INTRAVENOUS
  Administered 2023-03-15: 100 ug via INTRAVENOUS

## 2023-03-15 MED ORDER — MIDAZOLAM HCL 2 MG/2ML IJ SOLN
INTRAMUSCULAR | Status: AC
  Filled 2023-03-15: qty 2

## 2023-03-15 MED ORDER — ONDANSETRON HCL 4 MG/2ML IJ SOLN
INTRAMUSCULAR | Status: DC | PRN
Start: 2023-03-15 — End: 2023-03-15
  Administered 2023-03-15: 4 mg via INTRAVENOUS

## 2023-03-15 MED ORDER — 0.9 % SODIUM CHLORIDE (POUR BTL) OPTIME
TOPICAL | Status: DC | PRN
  Administered 2023-03-15: 1000 mL

## 2023-03-15 MED ORDER — DEXAMETHASONE SODIUM PHOSPHATE 10 MG/ML IJ SOLN
INTRAMUSCULAR | Status: DC | PRN
  Administered 2023-03-15: 10 mg via INTRAVENOUS

## 2023-03-15 MED ORDER — LIDOCAINE 2% (20 MG/ML) 5 ML SYRINGE
INTRAMUSCULAR | Status: DC | PRN
  Administered 2023-03-15: 60 mg via INTRAVENOUS

## 2023-03-15 MED ORDER — SUGAMMADEX SODIUM 200 MG/2ML IV SOLN
INTRAVENOUS | Status: DC | PRN
  Administered 2023-03-15: 254 mg via INTRAVENOUS

## 2023-03-15 MED ORDER — LACTATED RINGERS IV SOLN: INTRAVENOUS | Status: DC

## 2023-03-15 MED ORDER — BACITRACIN ZINC 500 UNIT/GM EX OINT
TOPICAL_OINTMENT | CUTANEOUS | Status: AC
  Filled 2023-03-15: qty 28.35

## 2023-03-15 MED ORDER — SODIUM CHLORIDE 0.9 % IR SOLN
Status: DC | PRN
  Administered 2023-03-15: 1000 mL

## 2023-03-15 MED ORDER — LIDOCAINE-EPINEPHRINE 1 %-1:100000 IJ SOLN
INTRAMUSCULAR | Status: DC | PRN
  Administered 2023-03-15: 4 mL

## 2023-03-15 MED ORDER — PROPOFOL 10 MG/ML IV BOLUS
INTRAVENOUS | Status: DC | PRN
  Administered 2023-03-15: 150 mg via INTRAVENOUS

## 2023-03-15 MED ORDER — LIDOCAINE-EPINEPHRINE 1 %-1:100000 IJ SOLN
INTRAMUSCULAR | Status: AC
  Filled 2023-03-15: qty 1

## 2023-03-15 MED ORDER — MIDAZOLAM HCL 5 MG/5ML IJ SOLN
INTRAMUSCULAR | Status: DC | PRN
  Administered 2023-03-15: 2 mg via INTRAVENOUS

## 2023-03-15 MED ORDER — ORAL CARE MOUTH RINSE: 15.0000 mL | Freq: Once | OROMUCOSAL | Status: AC

## 2023-03-15 MED ORDER — PROPOFOL 10 MG/ML IV BOLUS
INTRAVENOUS | Status: AC
  Filled 2023-03-15: qty 20

## 2023-03-15 MED ORDER — CHLORHEXIDINE GLUCONATE 0.12 % MT SOLN
15.0000 mL | Freq: Once | OROMUCOSAL | Status: AC
  Administered 2023-03-15: 15 mL via OROMUCOSAL
  Filled 2023-03-15: qty 15

## 2023-03-15 MED ORDER — FENTANYL CITRATE (PF) 100 MCG/2ML IJ SOLN
INTRAMUSCULAR | Status: AC
  Filled 2023-03-15: qty 2

## 2023-03-15 SURGICAL SUPPLY — 33 items
BAG COUNTER SPONGE SURGICOUNT (BAG) ×1 IMPLANT
BAG SPNG CNTER NS LX DISP (BAG) ×1
BLADE RAD40 ROTATE 4M 4 5PK (BLADE) IMPLANT
BLADE RAD60 ROTATE M4 4 5PK (BLADE) IMPLANT
BLADE TRICUT ROTATE M4 4 5PK (BLADE) ×1 IMPLANT
CANISTER SUCT 3000ML PPV (MISCELLANEOUS) ×1 IMPLANT
DRESSING NASAL KENNEDY 3.5X.9 (MISCELLANEOUS) IMPLANT
DRSG NASAL KENNEDY 3.5X.9 (MISCELLANEOUS)
DRSG NASOPORE 8CM (GAUZE/BANDAGES/DRESSINGS) IMPLANT
ELECT REM PT RETURN 9FT ADLT (ELECTROSURGICAL) ×1
ELECTRODE REM PT RTRN 9FT ADLT (ELECTROSURGICAL) IMPLANT
FILTER ARTHROSCOPY CONVERTOR (FILTER) IMPLANT
GLOVE ECLIPSE 7.5 STRL STRAW (GLOVE) ×1 IMPLANT
GOWN STRL REUS W/ TWL LRG LVL3 (GOWN DISPOSABLE) ×2 IMPLANT
GOWN STRL REUS W/TWL LRG LVL3 (GOWN DISPOSABLE) ×2
KIT BASIN OR (CUSTOM PROCEDURE TRAY) ×1 IMPLANT
KIT TURNOVER KIT B (KITS) ×1 IMPLANT
NDL PRECISIONGLIDE 27X1.5 (NEEDLE) ×1 IMPLANT
NEEDLE PRECISIONGLIDE 27X1.5 (NEEDLE) ×1
NS IRRIG 1000ML POUR BTL (IV SOLUTION) ×1 IMPLANT
PAD ARMBOARD 7.5X6 YLW CONV (MISCELLANEOUS) ×2 IMPLANT
PATTIES SURGICAL .5 X3 (DISPOSABLE) ×1 IMPLANT
SHEATH ENDOSCRUB 0 DEG (SHEATH) IMPLANT
SHEATH ENDOSCRUB 30 DEG (SHEATH) IMPLANT
SPECIMEN JAR SMALL (MISCELLANEOUS) ×1 IMPLANT
SWAB COLLECTION DEVICE MRSA (MISCELLANEOUS) IMPLANT
SWAB CULTURE ESWAB REG 1ML (MISCELLANEOUS) IMPLANT
SYR 50ML SLIP (SYRINGE) IMPLANT
TOWEL GREEN STERILE FF (TOWEL DISPOSABLE) ×1 IMPLANT
TRAY ENT MC OR (CUSTOM PROCEDURE TRAY) ×1 IMPLANT
TUBE CONNECTING 12X1/4 (SUCTIONS) ×1 IMPLANT
TUBING EXTENTION W/L.L. (IV SETS) IMPLANT
WATER STERILE IRR 1000ML POUR (IV SOLUTION) ×1 IMPLANT

## 2023-03-15 NOTE — Transfer of Care (Signed)
Immediate Anesthesia Transfer of Care Note  Patient: Angel Cherry  Procedure(s) Performed: ENDOSCOPIC ETHMOID AND MAXILLARY SINUS SURGERY (Left: Nose)  Patient Location: PACU  Anesthesia Type:General  Level of Consciousness: awake and alert   Airway & Oxygen Therapy: Patient Spontanous Breathing and Patient connected to face mask oxygen  Post-op Assessment: Report given to RN and Post -op Vital signs reviewed and stable  Post vital signs: Reviewed and stable  Last Vitals:  Vitals Value Taken Time  BP 132/62 03/15/23 1254  Temp    Pulse 69 03/15/23 1255  Resp 12 03/15/23 1255  SpO2 100 % 03/15/23 1255  Vitals shown include unfiled device data.  Last Pain:  Vitals:   03/15/23 1039  TempSrc:   PainSc: 7       Patients Stated Pain Goal: 3 (03/15/23 1039)  Complications: No notable events documented.

## 2023-03-15 NOTE — Op Note (Signed)
OPERATIVE REPORT  DATE OF SURGERY: 03/15/2023  PATIENT:  Angel Cherry,  72 y.o. female  PRE-OPERATIVE DIAGNOSIS:  Chronic left maxillary sinusitis; Chronic ethmoidal sinusitis  POST-OPERATIVE DIAGNOSIS:  Chronic left maxillary sinusitis; Chronic ethmoidal sinusitis  PROCEDURE:  Procedure(s): ENDOSCOPIC ETHMOID AND MAXILLARY SINUS SURGERY, left  SURGEON:  Susy Frizzle, MD  ASSISTANTS: None  ANESTHESIA:   General   EBL: 20 ml  DRAINS: None  LOCAL MEDICATIONS USED: Xylocaine with epinephrine  SPECIMEN: Left sinus contents for pathology, left maxillary sinus contents for fungal culture  COUNTS:  Correct  PROCEDURE DETAILS: The patient was taken to the operating room and placed on the operating table in the supine position. Following induction of general endotracheal anesthesia, the face was prepped and draped in a standard fashion.  Oxymetazoline spray was used preoperatively in the nasal cavities.  1% Xylocaine with epinephrine was infiltrated into the superior and posterior attachments of the left middle turbinate.  The 0 degree endoscope was used initially.  1.  Left endoscopic anterior ethmoidectomy.  The left infundibular region was inspected and the uncinate process was taken down at its base using a sickle knife.  The microdebrider was then used to perform an anterior ethmoidectomy starting with the bulla and then is on the waiting all of the anterior cells laterally to the lamina papyracea and posteriorly to the ground lamella.  The more superior cells were clear open to the skull base so no treatment was necessary there.  There was diffuse polypoid mucosal edema and fungal debris and purulent material present.  2.  Left endoscopic maxillary enterostomy with removal of tissue.  After the ethmoid dissection was completed the attention was then turned towards the maxillary antrum.  The natural ostium was enlarged anteriorly using a backbiting forceps and posteriorly using  Tru-Cut forceps.  The entire sinus was filled with polypoid mucosa and fungal debris and purulent material.  This was all irrigated out with a curved suction and saline irrigation.  There was minimal bleeding.  The left ethmoid cavity was packed with half of a nasal pore dressing.  The pharynx was suctioned blood and secretions.  Patient was awakened extubated and transferred to recovery in stable condition.    PATIENT DISPOSITION:  To PACU, stable

## 2023-03-15 NOTE — Anesthesia Postprocedure Evaluation (Signed)
Anesthesia Post Note  Patient: Angel Cherry  Procedure(s) Performed: ENDOSCOPIC ETHMOID AND MAXILLARY SINUS SURGERY (Left: Nose)     Patient location during evaluation: PACU Anesthesia Type: General Level of consciousness: awake and alert Pain management: pain level controlled Vital Signs Assessment: post-procedure vital signs reviewed and stable Respiratory status: spontaneous breathing, nonlabored ventilation, respiratory function stable and patient connected to nasal cannula oxygen Cardiovascular status: blood pressure returned to baseline and stable Postop Assessment: no apparent nausea or vomiting Anesthetic complications: no   No notable events documented.  Last Vitals:  Vitals:   03/15/23 1345 03/15/23 1400  BP: (!) 148/66 135/70  Pulse: (!) 52 (!) 48  Resp: 15 12  Temp:  36.7 C  SpO2: 95% 96%    Last Pain:  Vitals:   03/15/23 1400  TempSrc:   PainSc: 3                  [name] [name]

## 2023-03-15 NOTE — Interval H&P Note (Signed)
History and Physical Interval Note:  03/15/2023 12:02 PM  Angel Cherry  has presented today for surgery, with the diagnosis of Chronic left maxillary sinusitis; Chronic ethmoidal sinusitis.  The various methods of treatment have been discussed with the patient and family. After consideration of risks, benefits and other options for treatment, the patient has consented to  Procedure(s): ENDOSCOPIC ETHMOID AND MAXILLARY SINUS SURGERY (Left) as a surgical intervention.  The patient's history has been reviewed, patient examined, no change in status, stable for surgery.  I have reviewed the patient's chart and labs.  Questions were answered to the patient's satisfaction.     [name]

## 2023-03-15 NOTE — Anesthesia Procedure Notes (Signed)
Procedure Name: Intubation Date/Time: 03/15/2023 12:14 PM  Performed by: Rachel Moulds, CRNAPre-anesthesia Checklist: Patient identified, Emergency Drugs available, Suction available, Patient being monitored and Timeout performed Patient Re-evaluated:Patient Re-evaluated prior to induction Oxygen Delivery Method: Circle system utilized Preoxygenation: Pre-oxygenation with 100% oxygen Induction Type: IV induction Ventilation: Mask ventilation without difficulty Laryngoscope Size: Mac and 3 Grade View: Grade I Tube type: Oral Tube size: 7.0 mm Number of attempts: 2 Airway Equipment and Method: Stylet Placement Confirmation: breath sounds checked- equal and bilateral, CO2 detector, positive ETCO2 and ETT inserted through vocal cords under direct vision Secured at: 21 cm Tube secured with: Tape Dental Injury: Teeth and Oropharynx as per pre-operative assessment

## 2023-03-15 NOTE — Discharge Instructions (Addendum)
Use saline nasal spray every 30-60 minutes while awake.  May use it on both sides if desired.  Use Tylenol and/or Motrin as needed for any discomfort.  Resume Plavix in 5 days.

## 2023-03-16 ENCOUNTER — Encounter (HOSPITAL_COMMUNITY): Payer: Self-pay | Admitting: Otolaryngology

## 2023-03-16 LAB — SURGICAL PATHOLOGY

## 2023-04-13 LAB — FUNGUS CULTURE WITH STAIN

## 2023-04-13 LAB — FUNGAL ORGANISM REFLEX

## 2023-04-13 LAB — FUNGUS CULTURE RESULT

## 2023-04-19 ENCOUNTER — Telehealth: Payer: Medicare HMO | Admitting: Nurse Practitioner

## 2023-04-19 DIAGNOSIS — J069 Acute upper respiratory infection, unspecified: Secondary | ICD-10-CM

## 2023-04-19 MED ORDER — FLUTICASONE PROPIONATE 50 MCG/ACT NA SUSP
2.0000 | Freq: Every day | NASAL | 6 refills | Status: AC
Start: 2023-04-19 — End: ?

## 2023-04-19 NOTE — Progress Notes (Signed)
Angel Cherry,  I understand that you are prone to sinus infections, we do not recommend starting antibiotics or aggressive treatment before 7 days of symptoms as antibiotics do not work against viruses.   Providers prescribe antibiotics to treat infections caused by bacteria. Antibiotics are very powerful in treating bacterial infections when they are used properly. To maintain their effectiveness, they should be used only when necessary. Overuse of antibiotics has resulted in the development of superbugs that are resistant to treatment!    After careful review of your answers, I would not recommend an antibiotic for your condition.  Antibiotics are not effective against viruses and therefore should not be used to treat them. Common examples of infections caused by viruses include colds and flu   E-Visit for Sinus Problems  We are sorry that you are not feeling well.  Here is how we plan to help!  Based on what you have shared with me it looks like you have sinusitis.  Sinusitis is inflammation and infection in the sinus cavities of the head.  Based on your presentation I believe you most likely have Acute Viral Sinusitis.This is an infection most likely caused by a virus. There is not specific treatment for viral sinusitis other than to help you with the symptoms until the infection runs its course.  You may use an oral decongestant such as Mucinex D or if you have glaucoma or high blood pressure use plain Mucinex. Saline nasal spray help and can safely be used as often as needed for congestion, I have prescribed: Fluticasone nasal spray two sprays in each nostril once a day  Some authorities believe that zinc sprays or the use of Echinacea may shorten the course of your symptoms.  Sinus infections are not as easily transmitted as other respiratory infection, however we still recommend that you avoid close contact with loved ones, especially the very young and elderly.  Remember to wash your hands  thoroughly throughout the day as this is the number one way to prevent the spread of infection!  Home Care: Only take medications as instructed by your medical team. Do not take these medications with alcohol. A steam or ultrasonic humidifier can help congestion.  You can place a towel over your head and breathe in the steam from hot water coming from a faucet. Avoid close contacts especially the very young and the elderly. Cover your mouth when you cough or sneeze. Always remember to wash your hands.  Get Help Right Away If: You develop worsening fever or sinus pain. You develop a severe head ache or visual changes. Your symptoms persist after you have completed your treatment plan.  Make sure you Understand these instructions. Will watch your condition. Will get help right away if you are not doing well or get worse.   Thank you for choosing an e-visit.  Your e-visit answers were reviewed by a board certified advanced clinical practitioner to complete your personal care plan. Depending upon the condition, your plan could have included both over the counter or prescription medications.  Please review your pharmacy choice. Make sure the pharmacy is open so you can pick up prescription now. If there is a problem, you may contact your provider through [employer] and have the prescription routed to another pharmacy.  Your safety is important to [place]. If you have drug allergies check your prescription carefully.   For the next 24 hours you can use MyChart to ask questions about today's visit, request a non-urgent call back, or  ask for a work or school excuse. You will get an email in the next two days asking about your experience. I hope that your e-visit has been valuable and will speed your recovery.  Meds ordered this encounter  Medications   fluticasone (FLONASE) 50 MCG/ACT nasal spray    Sig: Place 2 sprays into both nostrils daily.    Dispense:  16 g    Refill:  6  I spent  approximately 5 minutes reviewing the patient's history, current symptoms and coordinating their care today.

## 2023-05-18 ENCOUNTER — Other Ambulatory Visit: Payer: Self-pay

## 2023-05-18 MED ORDER — IRBESARTAN 150 MG PO TABS: 150.0000 mg | ORAL_TABLET | Freq: Every day | ORAL | 1 refills | Status: DC

## 2023-06-21 IMAGING — CT CT CARDIAC CORONARY ARTERY CALCIUM SCORE
2 series · 15 of 20 positions shown, 17 images · non-contrast
Comparison: None.
COMPARISON: None.

Addendum:
EXAM:
OVER-READ INTERPRETATION  CT CHEST

The following report is an over-read performed by radiologist Dr.
Phillips Wicker [REDACTED] on 11/01/2021. This
over-read does not include interpretation of cardiac or coronary
anatomy or pathology. The coronary calcium score interpretation by
the cardiologist is attached.
CLINICAL DATA: Cardiovascular Disease Risk stratification
Coronary Calcium Score
TECHNIQUE: A gated, non-contrast computed tomography scan of the heart was
performed using 3mm slice thickness. Axial images were analyzed on a
dedicated workstation. Calcium scoring of the coronary arteries was
performed using the Agatston method.

[Series 3: cascseq 2.0 b35f 70% · axial · 0.39mm/px · z∈[+1203,+1317]mm · 7 of 86 slices shown]
[im 10/86  vessel]
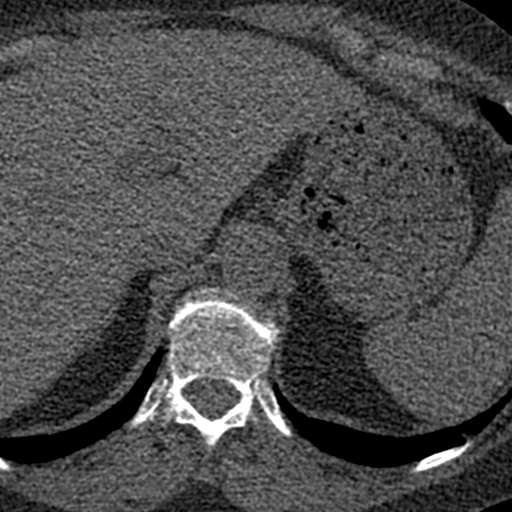
[im 19/86  vessel]
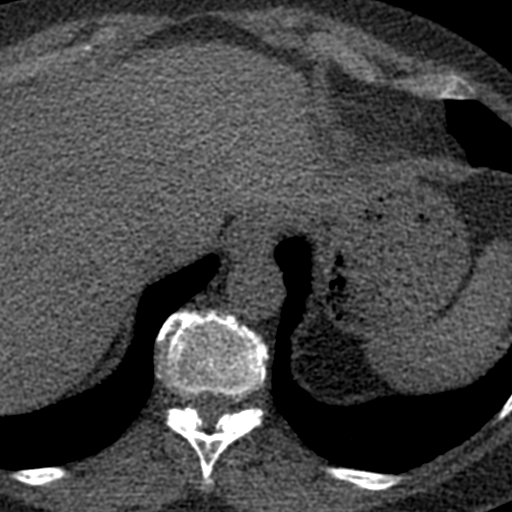
[im 29/86  vessel]
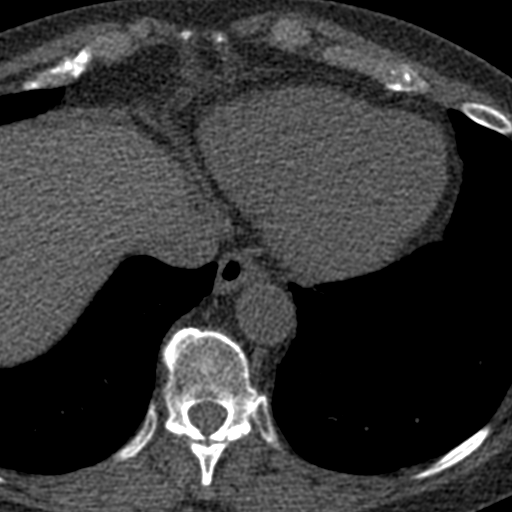
[im 38/86  vessel]
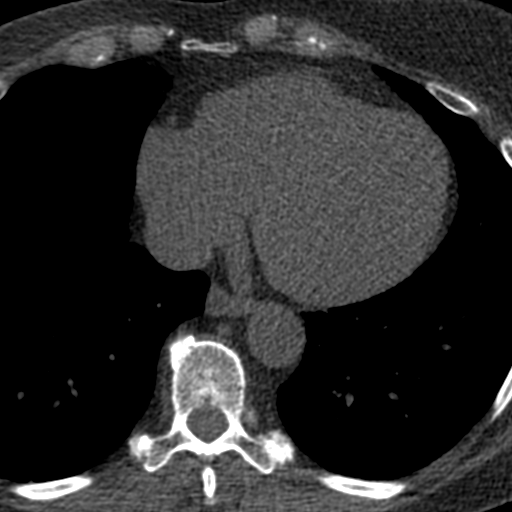
[im 48/86  vessel]
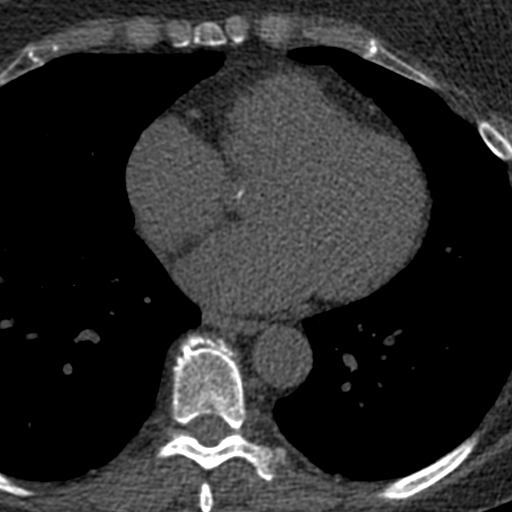
[im 57/86  vessel]
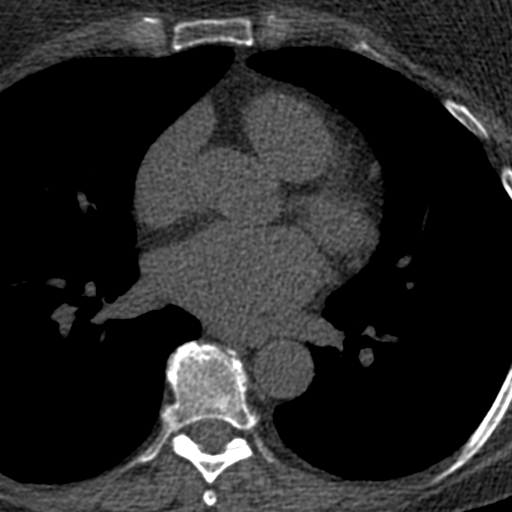
[im 67/86  vessel]
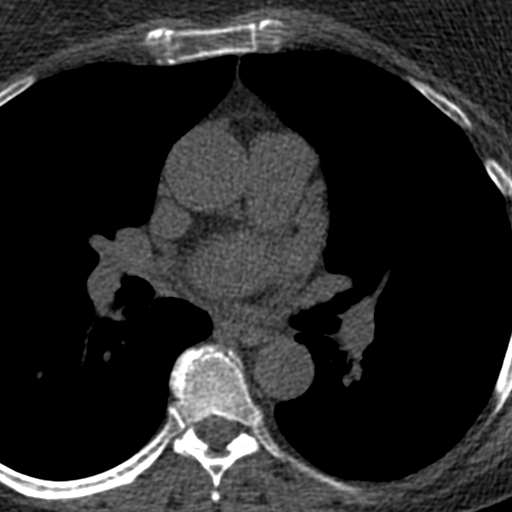

[Series 4: ax st full fov · axial · 0.81mm/px · z∈[+1203,+1335]mm · 8 of 86 slices shown, 10 images]
[im 10/86  vessel]
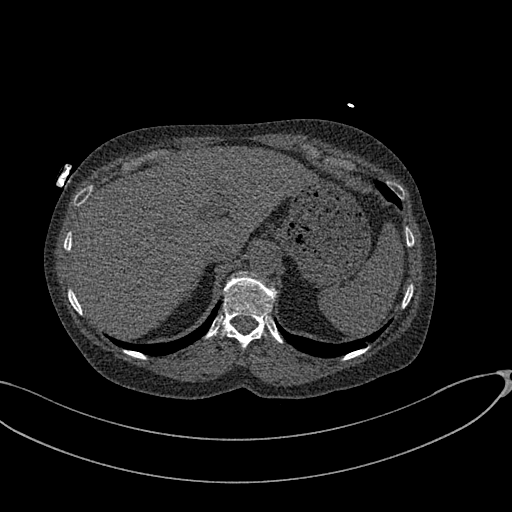
[im 10/86  lung]
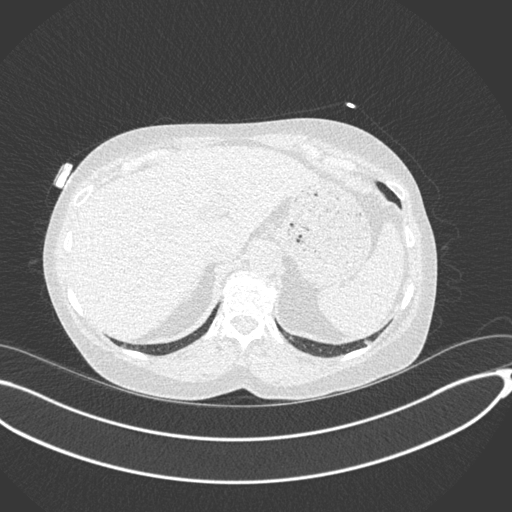
[im 19/86  vessel]
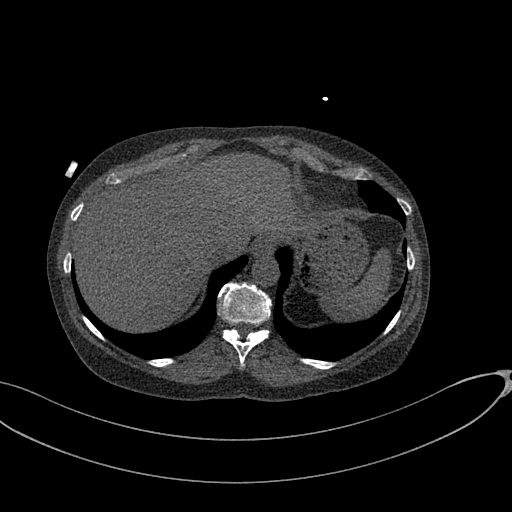
[im 29/86  vessel]
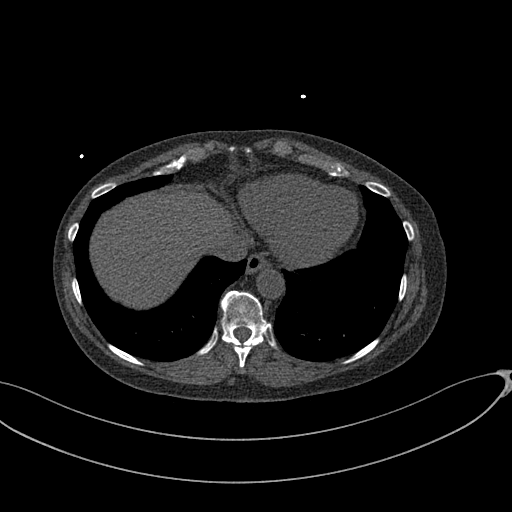
[im 38/86  vessel]
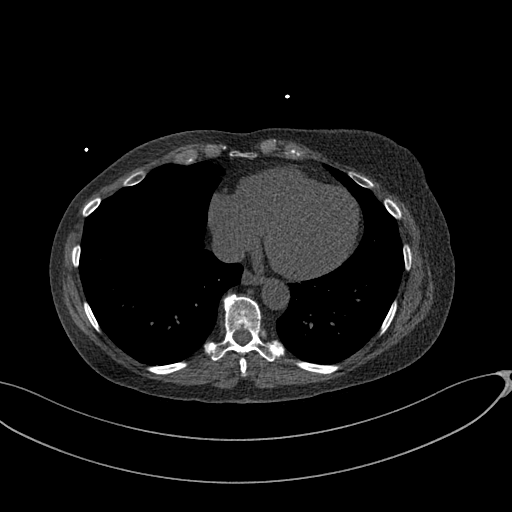
[im 48/86  vessel]
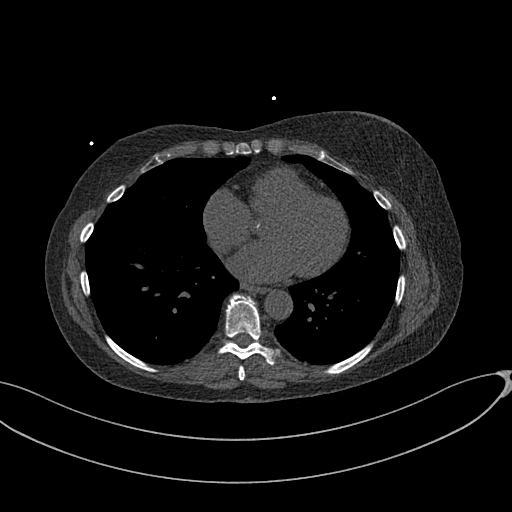
[im 48/86  lung]
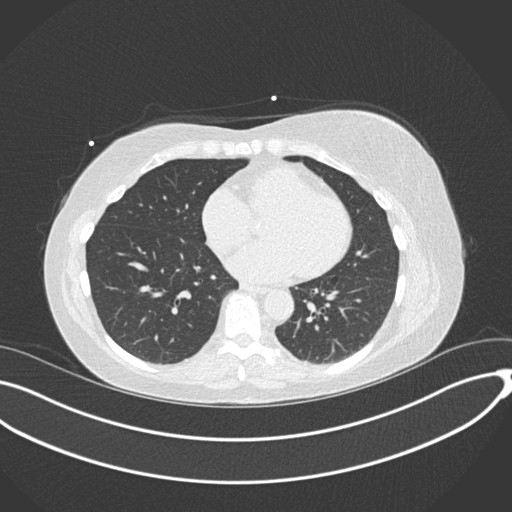
[im 57/86  vessel]
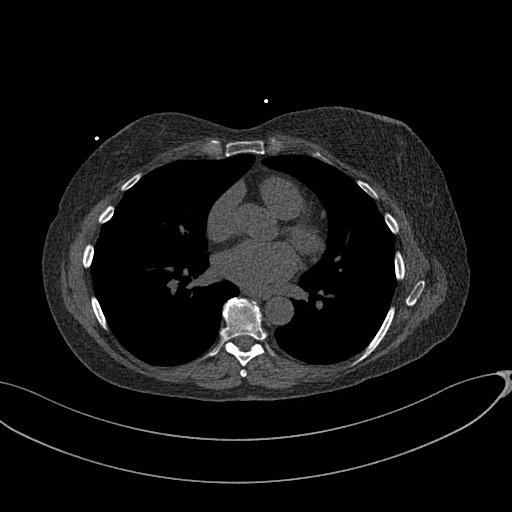
[im 67/86  vessel]
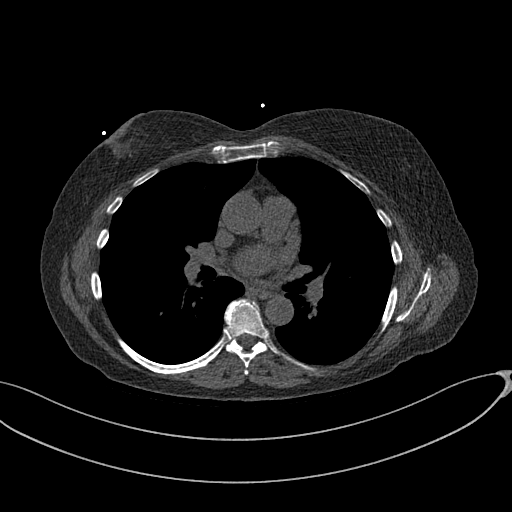
[im 76/86  vessel]
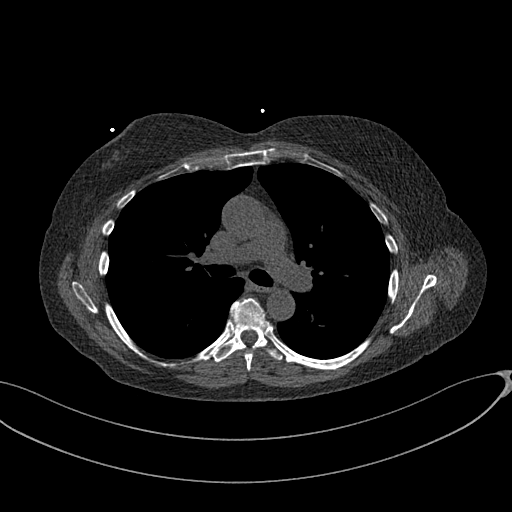

[15 of 20 positions shown; findings below may reference images not displayed]

FINDINGS: Atherosclerotic calcifications in the thoracic aorta. Within the
visualized portions of the thorax there are no suspicious appearing
pulmonary nodules or masses, there is no acute consolidative
airspace disease, no pleural effusions, no pneumothorax and no
lymphadenopathy. Visualized portions of the upper abdomen
demonstrate postoperative changes of prior cholecystectomy. There
are no aggressive appearing lytic or blastic lesions noted in the
visualized portions of the skeleton.
IMPRESSION: 1.  Aortic Atherosclerosis ([id number].F).
FINDINGS: Coronary Calcium Score:

Left main: 0

Left anterior descending artery: 0

Left circumflex artery: 0

Right coronary artery: 0

Total: 0

Percentile: NA

Pericardium: Normal.

Non-cardiac: See separate report from [REDACTED].
IMPRESSION: Coronary calcium score of 0.



If CAC=0, it is reasonable to withhold statin therapy and reassess
in 5 to 10 years, as long as higher risk conditions are absent
(diabetes mellitus, family history of premature CHD in first degree
relatives (males <55 years; females <65 years), cigarette smoking,
or LDL >=190 mg/dL).

If CAC is 1 to 99, it is reasonable to initiate statin therapy for
patients >=55 years of age.

If CAC is >=100 or >=75th percentile, it is reasonable to initiate
statin therapy at any age.

Cardiology referral should be considered for patients with CAC
scores >=400 or >=75th percentile.

*0830 AHA/ACC/AACVPR/AAPA/ABC/FERIENHAUS/RUDI/WESHLEY/Osiadacz/TIGER/AUJLA/ALARCIN
Guideline on the Management of Blood Cholesterol: A Report of the
American College of Cardiology/American Heart Association Task Force
on Clinical Practice Guidelines. J Am Coll Cardiol.
5055;73(24):3167-3701.

*** End of Addendum ***
EXAM:
OVER-READ INTERPRETATION  CT CHEST

The following report is an over-read performed by radiologist Dr.
Phillips Wicker [REDACTED] on 11/01/2021. This
over-read does not include interpretation of cardiac or coronary
anatomy or pathology. The coronary calcium score interpretation by
the cardiologist is attached.
FINDINGS: Atherosclerotic calcifications in the thoracic aorta. Within the
visualized portions of the thorax there are no suspicious appearing
pulmonary nodules or masses, there is no acute consolidative
airspace disease, no pleural effusions, no pneumothorax and no
lymphadenopathy. Visualized portions of the upper abdomen
demonstrate postoperative changes of prior cholecystectomy. There
are no aggressive appearing lytic or blastic lesions noted in the
visualized portions of the skeleton.
IMPRESSION: 1.  Aortic Atherosclerosis (QK4K2-51F.F).

## 2023-07-20 ENCOUNTER — Telehealth: Payer: Self-pay

## 2023-07-20 NOTE — Telephone Encounter (Signed)
   Name: Angel Cherry  DOB: Sep 12, 1950  MRN: [id number]  Primary Cardiologist: None   Preoperative team, please contact this patient and set up a phone call appointment for further preoperative risk assessment. Please obtain consent and complete medication review. Thank you for your help.  I confirm that guidance regarding antiplatelet and oral anticoagulation therapy has been completed and, if necessary, noted below.  Plavix prescribed by a noncardiology provider therefore recommendations for holding deferred to prescribing provider (neurology).   I also confirmed the patient resides in the state of [place]. As per [facility] Medical Board telemedicine laws, the patient must reside in the state in which the provider is licensed.   Ronney Asters, NP 07/20/2023, 9:17 AM [facility]

## 2023-07-20 NOTE — Telephone Encounter (Signed)
  Patient Consent for Virtual Visit        Angel Cherry has provided verbal consent on 07/20/2023 for a virtual visit (video or telephone).   CONSENT FOR VIRTUAL VISIT FOR:  Angel Cherry  By participating in this virtual visit I agree to the following:  I hereby voluntarily request, consent and authorize [facility] and its employed or contracted physicians, physician assistants, nurse practitioners or other licensed health care professionals (the Practitioner), to provide me with telemedicine health care services (the "Services") as deemed necessary by the treating Practitioner. I acknowledge and consent to receive the Services by the Practitioner via telemedicine. I understand that the telemedicine visit will involve communicating with the Practitioner through live audiovisual communication technology and the disclosure of certain medical information by electronic transmission. I acknowledge that I have been given the opportunity to request an in-person assessment or other available alternative prior to the telemedicine visit and am voluntarily participating in the telemedicine visit.  I understand that I have the right to withhold or withdraw my consent to the use of telemedicine in the course of my care at any time, without affecting my right to future care or treatment, and that the Practitioner or I may terminate the telemedicine visit at any time. I understand that I have the right to inspect all information obtained and/or recorded in the course of the telemedicine visit and may receive copies of available information for a reasonable fee.  I understand that some of the potential risks of receiving the Services via telemedicine include:  Delay or interruption in medical evaluation due to technological equipment failure or disruption; Information transmitted may not be sufficient (e.g. poor resolution of images) to allow for appropriate medical decision making by the  Practitioner; and/or  In rare instances, security protocols could fail, causing a breach of personal health information.  Furthermore, I acknowledge that it is my responsibility to provide information about my medical history, conditions and care that is complete and accurate to the best of my ability. I acknowledge that Practitioner's advice, recommendations, and/or decision may be based on factors not within their control, such as incomplete or inaccurate data provided by me or distortions of diagnostic images or specimens that may result from electronic transmissions. I understand that the practice of medicine is not an exact science and that Practitioner makes no warranties or guarantees regarding treatment outcomes. I acknowledge that a copy of this consent can be made available to me via my patient portal [facility] MyChart), or I can request a printed copy by calling the office of [facility].    I understand that my insurance will be billed for this visit.   I have read or had this consent read to me. I understand the contents of this consent, which adequately explains the benefits and risks of the Services being provided via telemedicine.  I have been provided ample opportunity to ask questions regarding this consent and the Services and have had my questions answered to my satisfaction. I give my informed consent for the services to be provided through the use of telemedicine in my medical care

## 2023-07-20 NOTE — Telephone Encounter (Signed)
   Pre-operative Risk Assessment    Patient Name: Angel Cherry  DOB: 1950/10/04 MRN: [id number]     Request for Surgical Clearance    Procedure:  right little toe excision of exostosis   Date of Surgery:  Clearance 08/04/23                                 Surgeon:  Dr. Blenda Bridegroom Surgeon's Group or Practice Name:  [facility]  Phone number:  [phone] Fax number:  [phone]   Type of Clearance Requested:   - Pharmacy:  Hold Clopidogrel (Plavix)     Type of Anesthesia:  None listed    Additional requests/questions:    Scarlette Shorts   07/20/2023, 8:47 AM

## 2023-07-20 NOTE — Telephone Encounter (Signed)
Spoke with patient who is agreeable to do a tele visit on 12/30 at 10:40 am. Med rec and consent done.

## 2023-07-26 ENCOUNTER — Ambulatory Visit: Payer: Medicare HMO | Attending: Physician Assistant

## 2023-07-26 DIAGNOSIS — Z01810 Encounter for preprocedural cardiovascular examination: Secondary | ICD-10-CM | POA: Diagnosis not present

## 2023-07-26 NOTE — Progress Notes (Signed)
Virtual Visit via Telephone Note   Because of Angel Cherry's co-morbid illnesses, she is at least at moderate risk for complications without adequate follow up.  This format is felt to be most appropriate for this patient at this time.  The patient did not have access to video technology/had technical difficulties with video requiring transitioning to audio format only (telephone).  All issues noted in this document were discussed and addressed.  No physical exam could be performed with this format.  Please refer to the patient's chart for her consent to telehealth for [facility].  Evaluation Performed:  Preoperative cardiovascular risk assessment _____________   Date:  07/26/2023   Patient ID:  Angel Cherry, DOB [birth year], MRN [id number] Patient Location:  Home Provider location:   Office  Primary Care Provider:  Nathaneil Canary, PA-C Primary Cardiologist:  None  Chief Complaint / Patient Profile   72 y.o. y/o female with a h/o tachycardia, hypertension, previous TIA on Plavix, palpitations who is pending right little toe excision of exostosis and presents today for telephonic preoperative cardiovascular risk assessment.  History of Present Illness    Angel Cherry is a 72 y.o. female who presents via audio/video conferencing for a telehealth visit today.  Pt was last seen in cardiology clinic on 11/27/2022 by Dr. Lalla Brothers.  At that time Angel Cherry was doing well other than some high blood pressure.  The patient is now pending procedure as outlined above. Since her last visit, she states that she has not had any cardiac issues.  No chest pains or shortness of breath.  No palpitations.  Her blood pressure has been much better controlled.  Is on Plavix for previous TIA which was prescribed by neurologist but now he is taken over by her primary care.  She does meet minimal METS on the DASI.   She can hold plavix 5-7 days from a cardiac standpoint and resume when medically  safe to do so. Her PCP prescribes her plavix but is unavailable for the next few months. Will send to Dr. Lalla Brothers as an Lorain Childes.  Past Medical History    Past Medical History:  Diagnosis Date   Abdominal pain    Abnormal ECG    Acute respiratory infection    Anemia    Arthropathy    Body aches    Bradycardia    Bronchitis    Bruit    Cough    Cushingoid facies    Dizziness    Fatigue    Fibromyalgia    Hematuria    Hyperlipidemia    Hypertension    IGT (impaired glucose tolerance)    Mitral regurgitation    Neck muscle spasm    Osteoarthritis    Pancreatic cyst    Paronychia of finger    Pollen allergies    PONV (postoperative nausea and vomiting)    Pre-syncope    Right hand weakness    Serum total bilirubin elevated    Sinus congestion    Sinusitis    SOB (shortness of breath) on exertion    SSS (sick sinus syndrome) (HCC)    TIA (transient ischemic attack)    URI (upper respiratory infection)    UTI (urinary tract infection)    VT (ventricular tachycardia) (HCC)    Weight gain    Wheezing    Past Surgical History:  Procedure Laterality Date   BACK SURGERY     CARPAL TUNNEL REPAIR     CATARACT EXTRACTION  2022   CHOLECYSTECTOMY     GALLBLADDER SURGERY     HYSTERECTOMY ABDOMINAL WITH SALPINGECTOMY     NASAL SINUS SURGERY Left 03/15/2023   Procedure: ENDOSCOPIC ETHMOID AND MAXILLARY SINUS SURGERY;  Surgeon: [name], MD;  Location: [facility] OR;  Service: ENT;  Laterality: Left;   TOTAL SHOULDER REPLACEMENT      Allergies  Allergies  Allergen Reactions   Metoprolol     Fatigue/weakness and low diastolic BP    Oxycodone Nausea And Vomiting   Terconazole Rash   Daypro [Oxaprozin]    Tape Other (See Comments)    Surgical tape Takes skin off      Venlafaxine Nausea Only    Home Medications    Prior to Admission medications   Medication Sig Start Date End Date Taking? Authorizing Provider  albuterol (VENTOLIN HFA) 108 (90 Base) MCG/ACT inhaler Inhale  2 puffs into the lungs every 4 (four) hours as needed for wheezing or shortness of breath. 08/19/16   [provider]  amLODipine (NORVASC) 2.5 MG tablet Take 1 tablet (2.5 mg total) by mouth daily. 02/01/23   Lanier Prude, MD  Azelastine HCl (ASTEPRO) 0.15 % SOLN Place 2 sprays into the nose daily as needed (Sinus).    [provider]  Cholecalciferol 1.25 MG (50000 UT) capsule Take 50,000 Units by mouth once a week. 03/18/21   [provider]  clopidogrel (PLAVIX) 75 MG tablet Take 75 mg by mouth daily.    [provider]  Coenzyme Q10 (COQ10) 200 MG CAPS Take 1 capsule by mouth daily. Patient taking differently: Take 200 mg by mouth daily. 08/04/21   Micki Riley, MD  famotidine (PEPCID) 40 MG tablet Take 40 mg by mouth daily.    [provider]  fluticasone (FLONASE) 50 MCG/ACT nasal spray Place 2 sprays into both nostrils daily. 04/19/23   Viviano Simas, FNP  irbesartan (AVAPRO) 150 MG tablet Take 1 tablet (150 mg total) by mouth daily. 05/18/23   Lanier Prude, MD  nadolol (CORGARD) 20 MG tablet Take 0.5 tablets (10 mg total) by mouth daily. 12/16/22   Lanier Prude, MD  oxybutynin (DITROPAN) 5 MG tablet Take 10 mg by mouth at bedtime. Patient not taking: Reported on 07/20/2023 03/24/21   [provider]  pantoprazole (PROTONIX) 40 MG tablet Take 40 mg by mouth 2 (two) times daily before a meal.    [provider]  pregabalin (LYRICA) 25 MG capsule Take 25 mg by mouth at bedtime. Patient not taking: Reported on 07/20/2023 06/15/22   [provider]  rosuvastatin (CRESTOR) 40 MG tablet Take 40 mg by mouth daily. 01/22/22   [provider]  tiZANidine (ZANAFLEX) 4 MG tablet Take 1 tablet by mouth every 6 (six) hours as needed for muscle spasms. 04/29/20   [provider]  tolterodine (DETROL LA) 4 MG 24 hr capsule Take 4 mg by mouth daily.    [provider]  traZODone (DESYREL) 150 MG  tablet Take 150 mg by mouth at bedtime as needed for sleep.    [provider]    Physical Exam    Vital Signs:  Angel Cherry does not have vital signs available for review today.  Given telephonic nature of communication, physical exam is limited. AAOx3. NAD. Normal affect.  Speech and respirations are unlabored.  Accessory Clinical Findings    None  Assessment & Plan    1.  Preoperative Cardiovascular Risk Assessment:  Angel Cherry perioperative  risk of a major cardiac event is 0.9% according to the Revised Cardiac Risk Index (RCRI).  Therefore, she is at low risk for perioperative complications.   Her functional capacity is good at 6.61 METs according to the [facility] Activity Status Index (DASI). Recommendations: According to ACC/AHA guidelines, no further cardiovascular testing needed.  The patient may proceed to surgery at acceptable risk.   Antiplatelet and/or Anticoagulation Recommendations: Clopidogrel (Plavix) can be held for 5-7 days prior to her surgery and resumed as soon as possible post op.   Time:   Today, I have spent 5 minutes with the patient with telehealth technology discussing medical history, symptoms, and management plan.     Sharlene Dory, PA-C  07/26/2023, 10:43 AM

## 2023-09-23 ENCOUNTER — Other Ambulatory Visit: Payer: Self-pay

## 2023-09-23 MED ORDER — AMLODIPINE BESYLATE 2.5 MG PO TABS: 2.5000 mg | ORAL_TABLET | Freq: Every day | ORAL | 1 refills | Status: DC

## 2023-11-21 NOTE — Progress Notes (Unsigned)
  Cardiology Office Note:  .   Date:  11/21/2023  ID:  Angel Cherry, DOB [birth year], MRN [id number] PCP: Mendel Stain  [facility] Providers Cardiologist:  None Electrophysiologist:  Boyce Byes, MD {  History of Present Illness: Angel Cherry is a 73 y.o. female w/PMHx of  HTN, HLD, TIA NSVT (normal echo and c.MRI)  She saw Dr. Marven Slimmer May 2024, infrequent fleeting dizzy spells, concerned about high BP Maintained on nadolol , amlodipine  added for BP  Today's visit is scheduled as an annual visit ROS:   She denies any palpitations or cardiac awareness She wears an AURA ring and at times notes a large spike in HR (briefly to 150's) by the ring data, does correlate with times she gets up to go to the BR with NO symptoms  She has known back trouble, b/l shoulder replacements and arthritis mentions b/l arm pain, tingling that has been felt 2/2 her orthopedic issues Felt she should mention it This is an  all day every day symptom No worse with physical activity  No CP, no SOB, DOE Her husband is disable and she care for their home inside and out, likes to walk Reports excellent exertional capacity  No dizzy spells, near syncope or syncope  Studies Reviewed: Aaron Aas    EKG done 03/15/23 and reviewed by myself:  SB 54bpm   11/04/2022 cMRI 1. Normal LV and RV size and function.  LVEF = 69%.  2. There is no late gadolinium enhancement in the left ventricular myocardium.  3. Normal T1, T2, ECV values. No evidence for myocarditis or infiltrative disease.  4. No significant valvular abnormalities.   Risk Assessment/Calculations:    Physical Exam:   VS:  There were no vitals taken for this visit.   Wt Readings from Last 3 Encounters:  03/15/23 140 lb (63.5 kg)  11/27/22 141 lb 3.2 oz (64 kg)  09/22/22 136 lb 9.6 oz (62 kg)    GEN: Well nourished, well developed in no acute distress NECK: No JVD; No carotid bruits CARDIAC: RRR, no murmurs, rubs,  gallops RESPIRATORY:  CTA b/l without rales, wheezing or rhonchi  ABDOMEN: Soft, non-tender, non-distended EXTREMITIES: No edema; No deformity   ASSESSMENT AND PLAN: .    NSVT No symptoms Continue nadolol   Advised if she has unusual spikes in HR not associated with exertion, or if w/symptoms to lease let us  know, can plan monitoring  HTN Recheck by myself is 136/80 Continue to monitor Follow with her PMD  3. B/l arm numbness/pain longstanding Does sound of back/shoulder etiology She follows with back specialist   Dispo: we will continue annual visits, sooner if needed  Signed, Debbie Fails, PA-C

## 2023-11-23 ENCOUNTER — Encounter: Payer: Self-pay | Admitting: Physician Assistant

## 2023-11-23 ENCOUNTER — Ambulatory Visit: Payer: Medicare HMO | Attending: Physician Assistant | Admitting: Physician Assistant

## 2023-11-23 VITALS — BP 142/70 | HR 63 | Ht 62.0 in | Wt 133.8 lb

## 2023-11-23 DIAGNOSIS — I4729 Other ventricular tachycardia: Secondary | ICD-10-CM

## 2023-11-23 DIAGNOSIS — I10 Essential (primary) hypertension: Secondary | ICD-10-CM | POA: Diagnosis not present

## 2023-11-23 NOTE — Patient Instructions (Signed)
 Medication Instructions:   Your physician recommends that you continue on your current medications as directed. Please refer to the Current Medication list given to you today.   *If you need a refill on your cardiac medications before your next appointment, please call your pharmacy*   Lab Work: NONE ORDERED  TODAY     If you have labs (blood work) drawn today and your tests are completely normal, you will receive your results only by: MyChart Message (if you have MyChart) OR A paper copy in the mail If you have any lab test that is abnormal or we need to change your treatment, we will call you to review the results.  Testing/Procedures: NONE ORDERED  TODAY    Follow-Up: At [facility], you and your health needs are our priority.  As part of our continuing mission to provide you with exceptional heart care, our providers are all part of one team.  This team includes your primary Cardiologist (physician) and Advanced Practice Providers or APPs (Physician Assistants and Nurse Practitioners) who all work together to provide you with the care you need, when you need it.  Your next appointment:    1 year(s)   Provider:   You may see Boyce Byes, MD or one of the following Advanced Practice Providers on your designated Care Team:   Mertha Abrahams, [place]     We recommend signing up for the patient portal called "MyChart".  Sign up information is provided on this After Visit Summary.  MyChart is used to connect with patients for Virtual Visits (Telemedicine).  Patients are able to view lab/test results, encounter notes, upcoming appointments, etc.  Non-urgent messages can be sent to your provider as well.   To learn more about what you can do with MyChart, go to [email or web address].   Other Instructions

## 2023-12-22 ENCOUNTER — Other Ambulatory Visit: Payer: Self-pay | Admitting: Cardiology

## 2024-01-17 ENCOUNTER — Telehealth: Payer: Self-pay | Admitting: *Deleted

## 2024-01-17 NOTE — Telephone Encounter (Signed)
 Angel Fuller,  [name] is requesting preoperative cardiac evaluation for anterior cervical fusion and discectomy.  Procedure is scheduled for 02/08/2024.  She was last seen in clinic by you on 11/23/2023.  She was doing well at that time.  Her blood pressure was fairly well-controlled.  Follow-up was planned for 1 year.  Would you be able to comment on cardiac risk for upcoming procedure?  Thank you for your help.  Please direct your response to CV DIV preop pool.  [name]. Rondalyn Belford NP-C     01/17/2024, 2:39 PM [facility] [address] Office [phone] Fax [phone]

## 2024-01-17 NOTE — Telephone Encounter (Signed)
   Pre-operative Risk Assessment    Patient Name: Angel Cherry  DOB: 10-21-1950 MRN: [id number]   Date of last office visit: 11/23/2023 Date of next office visit: N/A   Request for Surgical Clearance    Procedure:  C5-6 ANTERIOR CERVICAL DISCECTOMY & FUSION WITH PLATE AND AUTOGRAFT  Date of Surgery:  Clearance 02/08/24                                Surgeon:   Surgeon's Group or Practice Name:  [facility] Phone number:  [phone] Fax number:  [phone] ATTN:  PATRICIA ROBERTS   Type of Clearance Requested:   - Medical  - Pharmacy:  Hold Clopidogrel (Plavix) NOT IDICATED   Type of Anesthesia:  Not Indicated   Additional requests/questions:    [name]   01/17/2024, 1:45 PM

## 2024-01-24 NOTE — Telephone Encounter (Signed)
   Patient Name: Angel Cherry  DOB: [birth year] MRN: [id number]  Primary Cardiologist: None  Chart reviewed as part of pre-operative protocol coverage. Given past medical history and time since last visit, based on ACC/AHA guidelines, VINESSA MACCONNELL is at acceptable risk for the planned procedure without further cardiovascular testing.   Guidance on holding Plavix should come from prescribing provider.  The patient was advised that if she develops new symptoms prior to surgery to contact our office to arrange for a follow-up visit, and she verbalized understanding.  I will route this recommendation to the requesting party via Epic fax function and remove from pre-op pool.  Please call with questions.  Wyn Raddle, Jackee Shove, NP 01/24/2024, 1:07 PM

## 2024-02-01 NOTE — Telephone Encounter (Signed)
 Called and spoke with patient and she stated that Dr. Francis Pinal was who had originally prescribed Plavix. After he retired, her PCP Joesph Cedar, PA with [facility] has been filling it until her cardiologist would pick up and start filling it for her. She said that she is under the impression that Dr. Cindie would fill when it it time. She is asking if we need to contact [facility] and have them send over the clearance to them? Please advise.

## 2024-02-08 NOTE — Telephone Encounter (Signed)
 Surgeon office sent duplicate request. Surgery is planned for today. Notes were faxed to surgeon office on 01/24/24 from Jackee Alberts, NP.   In the notes from Jackee Alberts, NP he did indicate cardiology does not manage Plavix and the surgeon will need to reach out to prescriber managing Plavix.   I will send notes again today to surgeon office.

## 2024-02-10 NOTE — Telephone Encounter (Addendum)
 Called and spoke with requesting office and patient has already had procedure done.

## 2024-05-31 ENCOUNTER — Other Ambulatory Visit: Payer: Self-pay | Admitting: Cardiology

## 2024-06-01 MED ORDER — IRBESARTAN 150 MG PO TABS: 150.0000 mg | ORAL_TABLET | Freq: Every day | ORAL | 1 refills | Status: AC

## 2024-06-14 ENCOUNTER — Encounter: Payer: Self-pay | Admitting: Cardiology

## 2024-06-14 DIAGNOSIS — I10 Essential (primary) hypertension: Secondary | ICD-10-CM

## 2024-09-05 ENCOUNTER — Ambulatory Visit: Admitting: Pharmacist
